# Patient Record
Sex: Female | Born: 1965 | Race: White | Hispanic: No | Marital: Married | Smoking: Former smoker
Health system: Southern US, Community
[De-identification: ages and names within clinical notes are randomized; demographics above are authoritative.]

## PROBLEM LIST (undated history)

## (undated) DIAGNOSIS — T7840XA Allergy, unspecified, initial encounter: Secondary | ICD-10-CM

## (undated) DIAGNOSIS — R1310 Dysphagia, unspecified: Secondary | ICD-10-CM

## (undated) DIAGNOSIS — D649 Anemia, unspecified: Secondary | ICD-10-CM

## (undated) DIAGNOSIS — E739 Lactose intolerance, unspecified: Secondary | ICD-10-CM

## (undated) DIAGNOSIS — M2550 Pain in unspecified joint: Secondary | ICD-10-CM

## (undated) DIAGNOSIS — F988 Other specified behavioral and emotional disorders with onset usually occurring in childhood and adolescence: Secondary | ICD-10-CM

## (undated) DIAGNOSIS — F419 Anxiety disorder, unspecified: Secondary | ICD-10-CM

## (undated) DIAGNOSIS — R079 Chest pain, unspecified: Secondary | ICD-10-CM

## (undated) DIAGNOSIS — M549 Dorsalgia, unspecified: Secondary | ICD-10-CM

## (undated) DIAGNOSIS — F319 Bipolar disorder, unspecified: Secondary | ICD-10-CM

## (undated) DIAGNOSIS — R0602 Shortness of breath: Secondary | ICD-10-CM

## (undated) DIAGNOSIS — M7989 Other specified soft tissue disorders: Secondary | ICD-10-CM

## (undated) DIAGNOSIS — K219 Gastro-esophageal reflux disease without esophagitis: Secondary | ICD-10-CM

## (undated) DIAGNOSIS — K5900 Constipation, unspecified: Secondary | ICD-10-CM

## (undated) DIAGNOSIS — F32A Depression, unspecified: Secondary | ICD-10-CM

## (undated) HISTORY — DX: Anemia, unspecified: D64.9

## (undated) HISTORY — DX: Bipolar disorder, unspecified: F31.9

## (undated) HISTORY — DX: Allergy, unspecified, initial encounter: T78.40XA

## (undated) HISTORY — DX: Constipation, unspecified: K59.00

## (undated) HISTORY — DX: Chest pain, unspecified: R07.9

## (undated) HISTORY — DX: Other specified soft tissue disorders: M79.89

## (undated) HISTORY — DX: Dysphagia, unspecified: R13.10

## (undated) HISTORY — DX: Gastro-esophageal reflux disease without esophagitis: K21.9

## (undated) HISTORY — DX: Depression, unspecified: F32.A

## (undated) HISTORY — DX: Pain in unspecified joint: M25.50

## (undated) HISTORY — DX: Dorsalgia, unspecified: M54.9

## (undated) HISTORY — DX: Lactose intolerance, unspecified: E73.9

## (undated) HISTORY — PX: CHOLECYSTECTOMY: SHX55

## (undated) HISTORY — DX: Other specified behavioral and emotional disorders with onset usually occurring in childhood and adolescence: F98.8

## (undated) HISTORY — DX: Anxiety disorder, unspecified: F41.9

## (undated) HISTORY — DX: Shortness of breath: R06.02

---

## 2004-01-15 ENCOUNTER — Emergency Department (HOSPITAL_COMMUNITY): Admission: EM | Admit: 2004-01-15 | Discharge: 2004-01-15 | Payer: Self-pay | Admitting: Emergency Medicine

## 2004-02-06 ENCOUNTER — Observation Stay (HOSPITAL_COMMUNITY): Admission: RE | Admit: 2004-02-06 | Discharge: 2004-02-07 | Payer: Self-pay | Admitting: Surgery

## 2004-02-06 ENCOUNTER — Encounter (INDEPENDENT_AMBULATORY_CARE_PROVIDER_SITE_OTHER): Payer: Self-pay | Admitting: Specialist

## 2005-03-27 IMAGING — RF DG CHOLANGIOGRAM OPERATIVE
1 series · 6 of 6 positions shown · non-contrast
Comparison: none

CLINICAL DATA: Symptomatic cholelithiasis. Right upper quadrant pain.
 INTRAOPERATIVE CHOLANGIOGRAM
 Three images are submitted from the intraoperative cholangiogram. This shows normal caliber biliary system.  No filling defects are seen to suggest retained stone.  Contrast passes into the small bowel.  
 IMPRESSION 
 No evidence of obstruction or retained stone.

[Series 1: run · 6 of 6 slices shown]
[im 1/6]
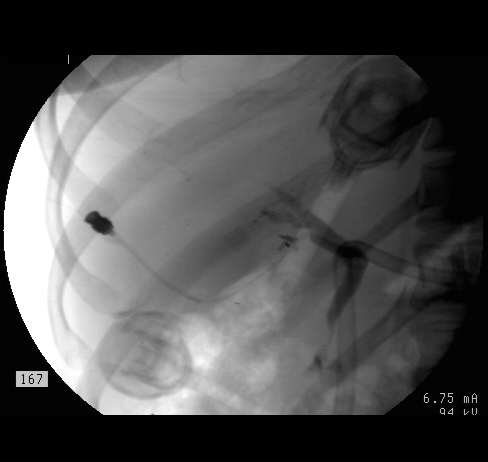
[im 2/6]
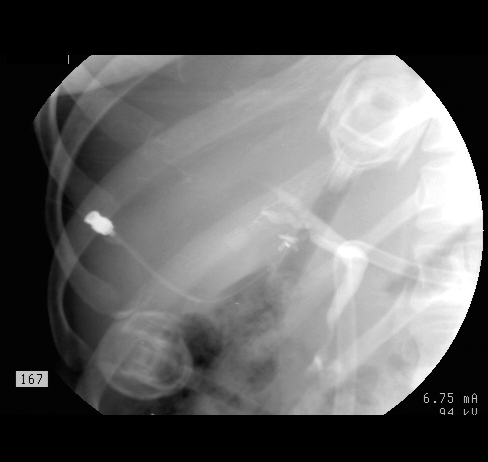
[im 3/6]
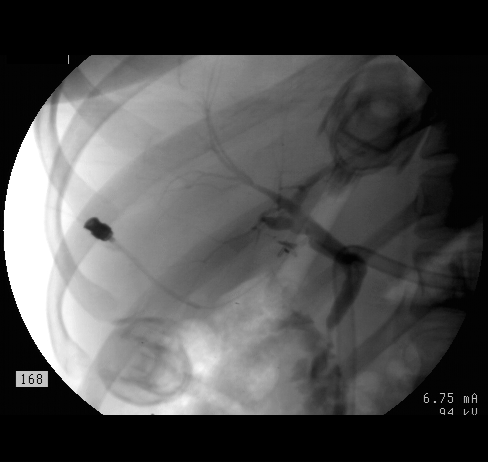
[im 4/6]
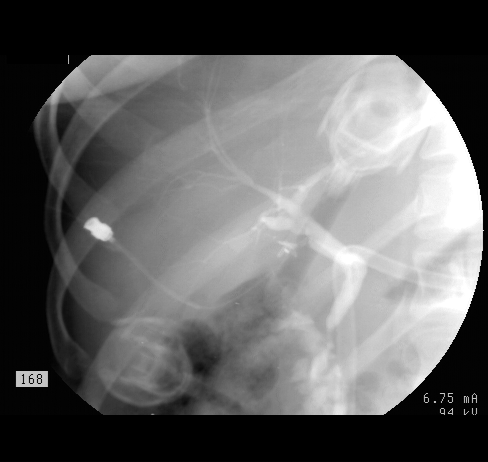
[im 5/6]
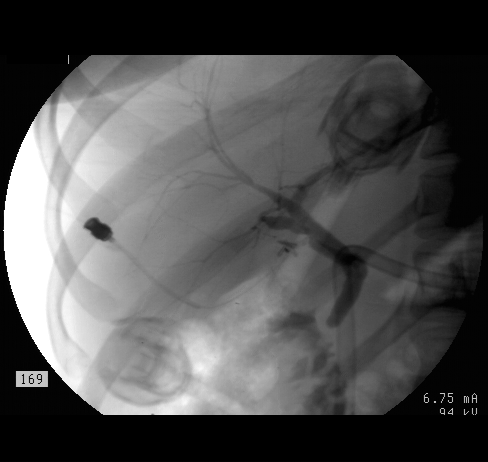
[im 6/6]
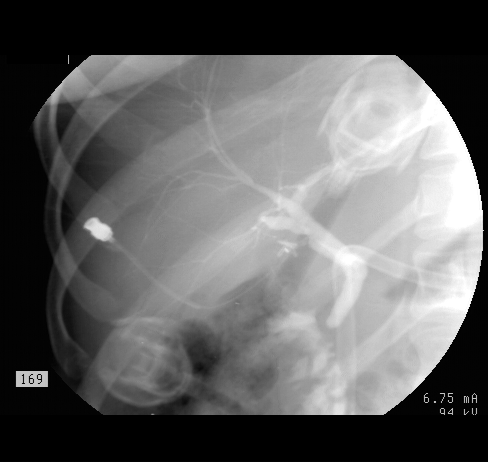

[6 of 6 positions shown; findings below may reference images not displayed]

## 2008-03-23 ENCOUNTER — Ambulatory Visit (HOSPITAL_COMMUNITY): Admission: RE | Admit: 2008-03-23 | Discharge: 2008-03-23 | Payer: Self-pay | Admitting: Family Medicine

## 2009-04-27 ENCOUNTER — Ambulatory Visit (HOSPITAL_COMMUNITY): Admission: RE | Admit: 2009-04-27 | Discharge: 2009-04-27 | Payer: Self-pay | Admitting: Family Medicine

## 2010-05-10 ENCOUNTER — Ambulatory Visit (HOSPITAL_COMMUNITY): Admission: RE | Admit: 2010-05-10 | Discharge: 2010-05-10 | Payer: Self-pay | Admitting: Anesthesiology

## 2011-04-19 NOTE — Op Note (Signed)
NAME:  Kathryn [name], Kathryn [name]                       ACCOUNT NO.:  [id number]   MEDICAL RECORD NO.:  [id number]                   PATIENT TYPE:  OBV   LOCATION:  0098                                 FACILITY:  [facility]   PHYSICIAN:  Abigail Miyamoto, M.D.              DATE OF BIRTH:  1966-11-04   DATE OF PROCEDURE:  02/06/2004  DATE OF DISCHARGE:                                 OPERATIVE REPORT   PREOPERATIVE DIAGNOSIS:  Symptomatic cholelithiasis.   POSTOPERATIVE DIAGNOSIS:  Symptomatic cholelithiasis.   PROCEDURE:  Laparoscopic cholecystectomy with intraoperative cholangiogram.   SURGEON:  Abigail Miyamoto, M.D.   ANESTHESIA:  General endotracheal anesthesia.   ESTIMATED BLOOD LOSS:  Minimal.   FINDINGS:  The patient was noted to have a normal cholangiogram.   PROCEDURE IN DETAIL:  The patient was brought to the operating room and  identified as Kathryn [name].  She was placed supine upon the operating  table and general anesthesia was induced.  Her abdomen was then prepped and  draped in the usual sterile fashion.  Using a #15 blade a small vertical  incision was made below the umbilicus.  The incision was carried down to the  fascia, which was opened up with a scalpel.  A hemostat was used to pass  through the peritoneal cavity.  Next a 0 Vicryl pursestring suture was  placed around the fascial opening.  The Hasson port was placed through the  opening and insufflation of the abdomen was begun.  A 12 mm port was then  placed in the patient's epigastrium and two 5 mm ports placed in the  patient's right flank under direct vision.  The gallbladder was then  grasped, retracted above the liver bed.  Several adhesions to the  gallbladder were taken down bluntly.  The cystic duct and cystic artery were  then easily dissected out.  The cystic artery was found to be slightly  posterolateral.  It was clipped twice proximally and once distally and  partly transected with scissors.  The  cystic duct was then clipped once  distally and partly opened with the laparoscopic scissors as well.  An  angiocatheter was then inserted in the right upper quadrant under direct  vision.  A cholangiocatheter was passed through this and placed into the  opening in the cystic duct under direct vision.  A cholangiogram was then  performed with contrast under direct fluoroscopy.  Good flow of contrast was  seen in the entire biliary system and duodenum without evidence of  abnormality.  The cholangiocatheter was then removed.  The cystic duct was  then clipped three times proximally and completely transected.  The  gallbladder was then slowly dissected free from the liver bed with the  electrocautery.  Once the gallbladder was free from the liver bed,  hemostasis was achieved with the cautery.  The gallbladder was then removed  through the incision at the umbilicus.  The 0 Vicryl  at the umbilicus was  tied in place, closing the fascial defect.  The liver bed was again examined  and hemostasis felt to have been achieved.  The abdomen was then irrigated  with normal saline.  All ports were then removed under direct vision and the  abdomen was deflated.  All incisions were anesthetized with 0.25% Marcaine  and closed with 4-0 Monocryl subcuticular  sutures.  Steri-Strips, gauze, and tape were then applied.  The patient  tolerated the procedure well.  All counts were correct at the end of the  procedure, and the patient was then extubated in the operating room and  taken in stable condition to the recovery room.                                               Abigail Miyamoto, M.D.    DB/MEDQ  D:  02/06/2004  T:  02/06/2004  Job:  [id number]

## 2011-06-10 ENCOUNTER — Other Ambulatory Visit: Payer: Self-pay | Admitting: Obstetrics & Gynecology

## 2011-06-10 DIAGNOSIS — Z1231 Encounter for screening mammogram for malignant neoplasm of breast: Secondary | ICD-10-CM

## 2011-06-14 ENCOUNTER — Ambulatory Visit (HOSPITAL_COMMUNITY): Payer: Medicaid Other

## 2011-06-20 ENCOUNTER — Ambulatory Visit (HOSPITAL_COMMUNITY)
Admission: RE | Admit: 2011-06-20 | Discharge: 2011-06-20 | Disposition: A | Discharge status: Home / Self Care | Payer: Medicaid Other | Source: Ambulatory Visit | Attending: Obstetrics & Gynecology | Admitting: Obstetrics & Gynecology

## 2011-06-20 DIAGNOSIS — Z1231 Encounter for screening mammogram for malignant neoplasm of breast: Secondary | ICD-10-CM

## 2012-05-25 ENCOUNTER — Other Ambulatory Visit (HOSPITAL_COMMUNITY): Payer: Self-pay | Admitting: Family Medicine

## 2012-05-25 DIAGNOSIS — Z1231 Encounter for screening mammogram for malignant neoplasm of breast: Secondary | ICD-10-CM

## 2012-06-25 ENCOUNTER — Ambulatory Visit (HOSPITAL_COMMUNITY)
Admission: RE | Admit: 2012-06-25 | Discharge: 2012-06-25 | Disposition: A | Discharge status: Home / Self Care | Payer: BC Managed Care – PPO | Source: Ambulatory Visit | Attending: Family Medicine | Admitting: Family Medicine

## 2012-06-25 DIAGNOSIS — Z1231 Encounter for screening mammogram for malignant neoplasm of breast: Secondary | ICD-10-CM | POA: Insufficient documentation

## 2013-05-24 ENCOUNTER — Other Ambulatory Visit (HOSPITAL_COMMUNITY): Payer: Self-pay | Admitting: *Deleted

## 2013-05-24 DIAGNOSIS — Z1231 Encounter for screening mammogram for malignant neoplasm of breast: Secondary | ICD-10-CM

## 2013-06-28 ENCOUNTER — Ambulatory Visit (HOSPITAL_COMMUNITY): Payer: BC Managed Care – PPO

## 2013-07-05 ENCOUNTER — Ambulatory Visit (HOSPITAL_COMMUNITY)
Admission: RE | Admit: 2013-07-05 | Discharge: 2013-07-05 | Disposition: A | Discharge status: Home / Self Care | Payer: BC Managed Care – PPO | Source: Ambulatory Visit | Attending: *Deleted | Admitting: *Deleted

## 2013-07-05 DIAGNOSIS — Z1231 Encounter for screening mammogram for malignant neoplasm of breast: Secondary | ICD-10-CM | POA: Insufficient documentation

## 2014-06-09 ENCOUNTER — Other Ambulatory Visit (HOSPITAL_COMMUNITY): Payer: Self-pay | Admitting: *Deleted

## 2014-06-09 DIAGNOSIS — Z1231 Encounter for screening mammogram for malignant neoplasm of breast: Secondary | ICD-10-CM

## 2014-07-06 ENCOUNTER — Ambulatory Visit (HOSPITAL_COMMUNITY)
Admission: RE | Admit: 2014-07-06 | Discharge: 2014-07-06 | Disposition: A | Discharge status: Home / Self Care | Payer: BC Managed Care – PPO | Source: Ambulatory Visit | Attending: *Deleted | Admitting: *Deleted

## 2014-07-06 DIAGNOSIS — Z1231 Encounter for screening mammogram for malignant neoplasm of breast: Secondary | ICD-10-CM | POA: Insufficient documentation

## 2015-06-27 ENCOUNTER — Other Ambulatory Visit (HOSPITAL_COMMUNITY): Payer: Self-pay | Admitting: *Deleted

## 2015-06-27 DIAGNOSIS — Z1231 Encounter for screening mammogram for malignant neoplasm of breast: Secondary | ICD-10-CM

## 2015-07-10 ENCOUNTER — Ambulatory Visit (HOSPITAL_COMMUNITY)
Admission: RE | Admit: 2015-07-10 | Discharge: 2015-07-10 | Disposition: A | Discharge status: Home / Self Care | Payer: BLUE CROSS/BLUE SHIELD | Source: Ambulatory Visit | Attending: *Deleted | Admitting: *Deleted

## 2015-07-10 DIAGNOSIS — Z1231 Encounter for screening mammogram for malignant neoplasm of breast: Secondary | ICD-10-CM | POA: Diagnosis not present

## 2016-06-17 ENCOUNTER — Other Ambulatory Visit: Payer: Self-pay | Admitting: *Deleted

## 2016-06-17 DIAGNOSIS — Z1231 Encounter for screening mammogram for malignant neoplasm of breast: Secondary | ICD-10-CM

## 2016-07-15 ENCOUNTER — Ambulatory Visit
Admission: RE | Admit: 2016-07-15 | Discharge: 2016-07-15 | Disposition: A | Discharge status: Home / Self Care | Payer: BLUE CROSS/BLUE SHIELD | Source: Ambulatory Visit | Attending: *Deleted | Admitting: *Deleted

## 2016-07-15 DIAGNOSIS — Z1231 Encounter for screening mammogram for malignant neoplasm of breast: Secondary | ICD-10-CM

## 2017-05-28 ENCOUNTER — Encounter: Payer: Self-pay | Admitting: Physician Assistant

## 2017-06-06 ENCOUNTER — Other Ambulatory Visit: Payer: Self-pay | Admitting: *Deleted

## 2017-06-06 ENCOUNTER — Ambulatory Visit (AMBULATORY_SURGERY_CENTER): Payer: Self-pay | Admitting: *Deleted

## 2017-06-06 VITALS — Ht 64.75 in | Wt 200.2 lb

## 2017-06-06 DIAGNOSIS — Z1231 Encounter for screening mammogram for malignant neoplasm of breast: Secondary | ICD-10-CM

## 2017-06-06 DIAGNOSIS — Z1211 Encounter for screening for malignant neoplasm of colon: Secondary | ICD-10-CM

## 2017-06-06 MED ORDER — NA SULFATE-K SULFATE-MG SULF 17.5-3.13-1.6 GM/177ML PO SOLN: 1.0000 | Freq: Once | ORAL | 0 refills | Status: AC

## 2017-06-06 NOTE — Progress Notes (Signed)
No egg or soy allergy known to patient  No issues with past sedation with any surgeries  or procedures, no intubation problems  No diet pills per patient No home 02 use per patient  No blood thinners per patient  Pt denies issues with constipation  No A fib or A flutter  EMMI video sent to pt's e mail  

## 2017-06-16 ENCOUNTER — Encounter: Payer: BLUE CROSS/BLUE SHIELD | Admitting: Gastroenterology

## 2017-07-01 ENCOUNTER — Encounter: Payer: Self-pay | Admitting: Gastroenterology

## 2017-07-11 ENCOUNTER — Encounter: Payer: Self-pay | Admitting: Gastroenterology

## 2017-07-11 ENCOUNTER — Ambulatory Visit (AMBULATORY_SURGERY_CENTER): Payer: BLUE CROSS/BLUE SHIELD | Admitting: Gastroenterology

## 2017-07-11 VITALS — BP 139/84 | HR 63 | Temp 98.0°F | Resp 12 | Ht 64.75 in | Wt 200.0 lb

## 2017-07-11 DIAGNOSIS — Z1212 Encounter for screening for malignant neoplasm of rectum: Secondary | ICD-10-CM | POA: Diagnosis not present

## 2017-07-11 DIAGNOSIS — Z1211 Encounter for screening for malignant neoplasm of colon: Secondary | ICD-10-CM

## 2017-07-11 MED ORDER — SODIUM CHLORIDE 0.9 % IV SOLN: 500.0000 mL | INTRAVENOUS | Status: AC

## 2017-07-11 NOTE — Patient Instructions (Signed)
YOU HAD AN ENDOSCOPIC PROCEDURE TODAY AT THE [facility]:   Refer to the procedure report that was given to you for any specific questions about what was found during the examination.  If the procedure report does not answer your questions, please call your gastroenterologist to clarify.  If you requested that your care partner not be given the details of your procedure findings, then the procedure report has been included in a sealed envelope for you to review at your convenience later.  YOU SHOULD EXPECT: Some feelings of bloating in the abdomen. Passage of more gas than usual.  Walking can help get rid of the air that was put into your GI tract during the procedure and reduce the bloating. If you had a lower endoscopy (such as a colonoscopy or flexible sigmoidoscopy) you may notice spotting of blood in your stool or on the toilet paper. If you underwent a bowel prep for your procedure, you may not have a normal bowel movement for a few days.  Please Note:  You might notice some irritation and congestion in your nose or some drainage.  This is from the oxygen used during your procedure.  There is no need for concern and it should clear up in a day or so.  SYMPTOMS TO REPORT IMMEDIATELY:   Following lower endoscopy (colonoscopy or flexible sigmoidoscopy):  Excessive amounts of blood in the stool  Significant tenderness or worsening of abdominal pains  Swelling of the abdomen that is new, acute  Fever of 100F or higher  For urgent or emergent issues, a gastroenterologist can be reached at any hour by calling [phone].   DIET:  We do recommend a small meal at first, but then you may proceed to your regular diet.  Drink plenty of fluids but you should avoid alcoholic beverages for 24 hours.  MEDICATIONS: Continue present medications.  Please see handouts given to you by your recovery nurse.  ACTIVITY:  You should plan to take it easy for the rest of today and you should NOT  DRIVE or use heavy machinery until tomorrow (because of the sedation medicines used during the test).    FOLLOW UP: Our staff will call the number listed on your records the next business day following your procedure to check on you and address any questions or concerns that you may have regarding the information given to you following your procedure. If we do not reach you, we will leave a message.  However, if you are feeling well and you are not experiencing any problems, there is no need to return our call.  We will assume that you have returned to your regular daily activities without incident.  If any biopsies were taken you will be contacted by phone or by letter within the next 1-3 weeks.  Please call us at [phone] if you have not heard about the biopsies in 3 weeks.   Thank you for allowing us to provide for your healthcare needs today.   SIGNATURES/CONFIDENTIALITY: You and/or your care partner have signed paperwork which will be entered into your electronic medical record.  These signatures attest to the fact that that the information above on your After Visit Summary has been reviewed and is understood.  Full responsibility of the confidentiality of this discharge information lies with you and/or your care-partner. 

## 2017-07-11 NOTE — Op Note (Signed)
[facility] Patient Name: Kathryn FarrierCharlene Brandy Procedure Date: 07/11/2017 1:55 PM MRN: [id number] Endoscopist: Napoleon FormKavitha V. Nandigam , MD Age: 51 Referring MD:  Date of Birth: [birth year] Gender: Female Account #: [id number] Procedure:                Colonoscopy Indications:              Screening for colorectal malignant neoplasm, This                            is the patient's first colonoscopy Medicines:                Monitored Anesthesia Care Procedure:                Pre-Anesthesia Assessment:                           - Prior to the procedure, a History and Physical                            was performed, and patient medications and                            allergies were reviewed. The patient's tolerance of                            previous anesthesia was also reviewed. The risks                            and benefits of the procedure and the sedation                            options and risks were discussed with the patient.                            All questions were answered, and informed consent                            was obtained. Prior Anticoagulants: The patient has                            taken no previous anticoagulant or antiplatelet                            agents. ASA Grade Assessment: II - A patient with                            mild systemic disease. After reviewing the risks                            and benefits, the patient was deemed in                            satisfactory condition to undergo the procedure.  After obtaining informed consent, the colonoscope                            was passed under direct vision. Throughout the                            procedure, the patient's blood pressure, pulse, and                            oxygen saturations were monitored continuously. The                            Colonoscope was introduced through the anus and                            advanced to the the  terminal ileum, with                            identification of the appendiceal orifice and IC                            valve. The colonoscopy was performed without                            difficulty. The patient tolerated the procedure                            well. The quality of the bowel preparation was                            excellent. The terminal ileum, ileocecal valve,                            appendiceal orifice, and rectum were photographed. Scope In: 1:57:45 PM Scope Out: 2:11:19 PM Scope Withdrawal Time: 0 hours 8 minutes 53 seconds  Total Procedure Duration: 0 hours 13 minutes 34 seconds  Findings:                 The perianal and digital rectal examinations were                            normal.                           Non-bleeding internal hemorrhoids were found during                            retroflexion. The hemorrhoids were small.                           The exam was otherwise without abnormality. Complications:            No immediate complications. Estimated Blood Loss:     Estimated blood loss: none. Impression:               - Non-bleeding internal hemorrhoids.                           -  The examination was otherwise normal.                           - No specimens collected. Recommendation:           - Patient has a contact number available for                            emergencies. The signs and symptoms of potential                            delayed complications were discussed with the                            patient. Return to normal activities tomorrow.                            Written discharge instructions were provided to the                            patient.                           - Resume previous diet.                           - Continue present medications.                           - Repeat colonoscopy in 10 years for screening                            purposes. Napoleon Form, MD 07/11/2017 2:19:46  PM This report has been signed electronically.

## 2017-07-11 NOTE — Progress Notes (Signed)
To recovery, report to Hines, RN, VSS 

## 2017-07-11 NOTE — Progress Notes (Signed)
Pt's states no medical or surgical changes since previsit or office visit. 

## 2017-07-14 ENCOUNTER — Telehealth: Payer: Self-pay

## 2017-07-14 NOTE — Telephone Encounter (Signed)
Attempted to reach pt. With follow up call following endoscopic procedure 07/11/2017.   LM on pt. Ans machine to call if she has any questions or concerns.

## 2017-07-14 NOTE — Telephone Encounter (Signed)
Attempted to reach pt. With follow up call following endoscopic procedure 07/11/17.  LM on pt. Ans. Machine.   Will try to reach pt. Later today.

## 2017-07-17 ENCOUNTER — Ambulatory Visit
Admission: RE | Admit: 2017-07-17 | Discharge: 2017-07-17 | Disposition: A | Discharge status: Home / Self Care | Payer: BLUE CROSS/BLUE SHIELD | Source: Ambulatory Visit | Attending: *Deleted | Admitting: *Deleted

## 2017-07-17 DIAGNOSIS — Z1231 Encounter for screening mammogram for malignant neoplasm of breast: Secondary | ICD-10-CM

## 2018-06-09 ENCOUNTER — Other Ambulatory Visit: Payer: Self-pay | Admitting: *Deleted

## 2018-06-09 DIAGNOSIS — Z1231 Encounter for screening mammogram for malignant neoplasm of breast: Secondary | ICD-10-CM

## 2018-07-20 ENCOUNTER — Ambulatory Visit: Payer: BLUE CROSS/BLUE SHIELD

## 2018-08-17 ENCOUNTER — Ambulatory Visit: Payer: BLUE CROSS/BLUE SHIELD

## 2018-09-28 ENCOUNTER — Ambulatory Visit: Payer: BLUE CROSS/BLUE SHIELD

## 2018-10-12 ENCOUNTER — Ambulatory Visit
Admission: RE | Admit: 2018-10-12 | Discharge: 2018-10-12 | Disposition: A | Discharge status: Home / Self Care | Payer: BLUE CROSS/BLUE SHIELD | Source: Ambulatory Visit | Attending: *Deleted | Admitting: *Deleted

## 2018-10-12 DIAGNOSIS — Z1231 Encounter for screening mammogram for malignant neoplasm of breast: Secondary | ICD-10-CM

## 2019-04-06 ENCOUNTER — Ambulatory Visit: Payer: BLUE CROSS/BLUE SHIELD | Admitting: Orthopaedic Surgery

## 2019-04-06 ENCOUNTER — Other Ambulatory Visit: Payer: Self-pay

## 2019-04-06 ENCOUNTER — Encounter: Payer: Self-pay | Admitting: Orthopaedic Surgery

## 2019-04-06 DIAGNOSIS — M25531 Pain in right wrist: Secondary | ICD-10-CM | POA: Diagnosis not present

## 2019-04-06 NOTE — Progress Notes (Signed)
Office Visit Note   Patient: Kathryn [name]           Date of Birth: 1966-08-04           MRN: [id number] Visit Date: 04/06/2019              Requested by: Marva Panda, NP [address] [place], [place] [address] PCP: Marva Panda, NP   Assessment & Plan: Visit Diagnoses:  1. Pain in right wrist     Plan: We will obtain EMG nerve conduction studies to rule out right carpal tunnel syndrome as a source of her right wrist pain.  In the interim she will take over-the-counter sedatives and also continue using the wrist brace.  Questions were encouraged and answered. Discussed other options with her which would include splinting, NSAIDs and time versus cortisone injection.  She states that she would rather find out the source of her pain.  Follow-Up Instructions: Return After EMG/NCS.   Orders:  Orders Placed This Encounter  Procedures   Ambulatory referral to Physical Medicine Rehab   No orders of the defined types were placed in this encounter.     Procedures: No procedures performed   Clinical Data: No additional findings.   Subjective: Chief Complaint  Patient presents with   Right Wrist - Pain    HPI His right 53 year old female seen for the first time.  We have know her due to the fact that we have performed bilateral knee replacements and her husband.  She comes in due to right wrist pain.  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  She does describe fact that her hand falls asleep and she has to shake her hand due to the numbness and pain.  He notes that the pain does occasionally radiate up the arm.  She also notes some swelling about the wrist states this is gone down since she began trying a wrist brace.  She is had pain in her right hand and wrist for years now it is becoming worse again due to doing remote learning as a Runner, broadcasting/film/video.  She denies any injury to the  wrist. Review of Systems See HPI  Objective: Vital Signs: There were no vitals taken for this visit.  Physical Exam Constitutional:      Appearance: She is not ill-appearing or diaphoretic.  Pulmonary:     Effort: Pulmonary effort is normal.  Neurological:     Mental Status: She is alert and oriented to person, place, and time.  Psychiatric:        Mood and Affect: Mood normal.     Ortho Exam Positive Tinel's over the median nerve right wrist negative on the left.  Negative Phalen's bilaterally.  Negative compression test bilaterally.  No rashes skin lesions ulcerations erythema or edema of either wrist.  She has full dorsiflexion and volar flexion of the wrist without pain.  Full sensation throughout both hands to light touch.  Radial pulses 2+ bilateral.  Specialty Comments:  No specialty comments available.  Imaging: No results found.   PMFS History: There are no active problems to display for this patient.  Past Medical History:  Diagnosis Date   Allergy    Anemia    with pregnancy    Anxiety    GERD (gastroesophageal reflux disease)    mild- prn tums     Family History  Problem Relation Age  of Onset   Stomach cancer Maternal Grandfather    Cervical cancer Mother    Colon cancer Neg Hx    Colon polyps Neg Hx    Rectal cancer Neg Hx     Past Surgical History:  Procedure Laterality Date   CESAREAN SECTION     CHOLECYSTECTOMY     Social History   Occupational History   Not on file  Tobacco Use   Smoking status: Former Smoker   Smokeless tobacco: Never Used  Substance and Sexual Activity   Alcohol use: Yes    Comment: very rare   Drug use: No   Sexual activity: Not on file

## 2019-05-06 ENCOUNTER — Ambulatory Visit (INDEPENDENT_AMBULATORY_CARE_PROVIDER_SITE_OTHER): Payer: BLUE CROSS/BLUE SHIELD | Admitting: Physical Medicine and Rehabilitation

## 2019-05-06 ENCOUNTER — Other Ambulatory Visit: Payer: Self-pay

## 2019-05-06 ENCOUNTER — Encounter: Payer: Self-pay | Admitting: Physical Medicine and Rehabilitation

## 2019-05-06 DIAGNOSIS — R202 Paresthesia of skin: Secondary | ICD-10-CM

## 2019-05-06 NOTE — Procedures (Signed)
EMG & NCV Findings: Evaluation of the right median motor nerve showed prolonged distal onset latency (4.5 ms) and decreased conduction velocity (Elbow-Wrist, 48 m/s).  The right median (across palm) sensory nerve showed prolonged distal peak latency (Wrist, 4.6 ms).  All remaining nerves (as indicated in the following tables) were within normal limits.    All examined muscles (as indicated in the following table) showed no evidence of electrical instability.    Impression: The above electrodiagnostic study is ABNORMAL and reveals evidence of a moderate right median nerve entrapment at the wrist (carpal tunnel syndrome) affecting sensory and motor components. This does not necessarily explain symptoms in the ring and fifth digit as she would expect this to be more of the radial digits but it can include the ring finger.  There is no significant electrodiagnostic evidence of any other focal nerve entrapment, brachial plexopathy or cervical radiculopathy.   Recommendations: 1.  Follow-up with referring physician. 2.  Continue current management of symptoms. 3.  Continue use of resting splint at night-time and as needed during the day. 4.  Suggest surgical evaluation.  ___________________________ Kathryn PlummerFred Taffie [name] FAAPMR Board Certified, American Board of Physical Medicine and Rehabilitation    Nerve Conduction Studies Anti Sensory Summary Table   Stim Site NR Peak (ms) Norm Peak (ms) P-T Amp (V) Norm P-T Amp Site1 Site2 Delta-P (ms) Dist (cm) Vel (m/s) Norm Vel (m/s)  Right Median Acr Palm Anti Sensory (2nd Digit)  32.2C  Wrist    *4.6 <3.6 16.5 >10 Wrist Palm 2.6 0.0    Palm    2.0 <2.0 7.1         Right Radial Anti Sensory (Base 1st Digit)  31.5C  Wrist    2.2 <3.1 20.6  Wrist Base 1st Digit 2.2 0.0    Right Ulnar Anti Sensory (5th Digit)  32.2C  Wrist    3.1 <3.7 17.6 >15.0 Wrist 5th Digit 3.1 14.0 45 >38   Motor Summary Table   Stim Site NR Onset (ms) Norm Onset (ms) O-P Amp (mV)  Norm O-P Amp Site1 Site2 Delta-0 (ms) Dist (cm) Vel (m/s) Norm Vel (m/s)  Right Median Motor (Abd Poll Brev)  31.8C  Wrist    *4.5 <4.2 7.9 >5 Elbow Wrist 4.1 19.5 *48 >50  Elbow    8.6  7.4         Right Ulnar Motor (Abd Dig Min)  31.9C  Wrist    3.0 <4.2 12.6 >3 B Elbow Wrist 3.3 18.5 56 >53  B Elbow    6.3  11.7  A Elbow B Elbow 1.1 9.5 86 >53  A Elbow    7.4  12.0          EMG   Side Muscle Nerve Root Ins Act Fibs Psw Amp Dur Poly Recrt Int Dennie BiblePat Comment  Right Abd Poll Brev Median C8-T1 Nml Nml Nml Nml Nml 0 Nml Nml   Right 1stDorInt Ulnar C8-T1 Nml Nml Nml Nml Nml 0 Nml Nml   Right PronatorTeres Median C6-7 Nml Nml Nml Nml Nml 0 Nml Nml   Right Biceps Musculocut C5-6 Nml Nml Nml Nml Nml 0 Nml Nml   Right Deltoid Axillary C5-6 Nml Nml Nml Nml Nml 0 Nml Nml     Nerve Conduction Studies Anti Sensory Left/Right Comparison   Stim Site L Lat (ms) R Lat (ms) L-R Lat (ms) L Amp (V) R Amp (V) L-R Amp (%) Site1 Site2 L Vel (m/s) R Vel (m/s) L-R Vel (m/s)  Median Acr Palm Anti Sensory (2nd Digit)  32.2C  Wrist  *4.6   16.5  Wrist Palm     Palm  2.0   7.1        Radial Anti Sensory (Base 1st Digit)  31.5C  Wrist  2.2   20.6  Wrist Base 1st Digit     Ulnar Anti Sensory (5th Digit)  32.2C  Wrist  3.1   17.6  Wrist 5th Digit  45    Motor Left/Right Comparison   Stim Site L Lat (ms) R Lat (ms) L-R Lat (ms) L Amp (mV) R Amp (mV) L-R Amp (%) Site1 Site2 L Vel (m/s) R Vel (m/s) L-R Vel (m/s)  Median Motor (Abd Poll Brev)  31.8C  Wrist  *4.5   7.9  Elbow Wrist  *48   Elbow  8.6   7.4        Ulnar Motor (Abd Dig Min)  31.9C  Wrist  3.0   12.6  B Elbow Wrist  56   B Elbow  6.3   11.7  A Elbow B Elbow  86   A Elbow  7.4   12.0           Waveforms:

## 2019-05-06 NOTE — Progress Notes (Signed)
  Numeric Pain Rating Scale and Functional Assessment Average Pain 8   In the last MONTH (on 0-10 scale) has pain interfered with the following?  1. General activity like being  able to carry out your everyday physical activities such as walking, climbing stairs, carrying groceries, or moving a chair?  Rating(6)     

## 2019-05-06 NOTE — Progress Notes (Signed)
Kathryn RisingCharlene Motley [name] - 53 y.o. female MRN [id number]  Date of birth: February 08, 1966  Office Visit Note: Visit Date: 05/06/2019 PCP: Marva PandaMillsaps, Kimberly, NP Referred by: Marva PandaMillsaps, Kimberly, NP  Subjective: Chief Complaint  Patient presents with  . Right Hand - Pain, Numbness, Tingling  . Right Forearm - Tingling, Pain, Numbness  . Right Wrist - Pain, Numbness, Tingling   HPI:  Kathryn [name] is a 53 y.o. female who comes in today At the request of Dr. Doneen Poissonhristopher Blackman for electrodiagnostic of the right upper limb.  She reports chronic worsening severe at times pain in the right wrist with numbness tingling in the right hand in particular the ring and fifth digit.  She does get some referral up in the forearm.  She reports many years of worsening symptoms.  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  She denies left-sided complaints.  She is right-hand dominant.  She has no radicular pain complaints.  No prior carpal tunnel release surgery or electrodiagnostic study.  Symptoms worse with movement at night and does get some relief wearing the brace and using medication.  ROS Otherwise per HPI.  Assessment & Plan: Visit Diagnoses:  1. Paresthesia of skin     Plan: Impression: The above electrodiagnostic study is ABNORMAL and reveals evidence of a moderate right median nerve entrapment at the wrist (carpal tunnel syndrome) affecting sensory and motor components.  This does not necessarily explain symptoms in the ring and fifth digit as she would expect this to be more of the radial digits but it can include the ring finger.  There is no significant electrodiagnostic evidence of any other focal nerve entrapment, brachial plexopathy or cervical radiculopathy.   Recommendations: 1.  Follow-up with referring physician. 2.  Continue current management of symptoms. 3.  Continue use of resting splint at night-time and as needed during the day. 4.  Suggest surgical  evaluation.  Meds & Orders: No orders of the defined types were placed in this encounter.   Orders Placed This Encounter  Procedures  . NCV with EMG (electromyography)    Follow-up: Return for Doneen Poissonhristopher Blackman, M.D..   Procedures: No procedures performed  EMG & NCV Findings: Evaluation of the right median motor nerve showed prolonged distal onset latency (4.5 ms) and decreased conduction velocity (Elbow-Wrist, 48 m/s).  The right median (across palm) sensory nerve showed prolonged distal peak latency (Wrist, 4.6 ms).  All remaining nerves (as indicated in the following tables) were within normal limits.    All examined muscles (as indicated in the following table) showed no evidence of electrical instability.    Impression: The above electrodiagnostic study is ABNORMAL and reveals evidence of a moderate right median nerve entrapment at the wrist (carpal tunnel syndrome) affecting sensory and motor components. This does not necessarily explain symptoms in the ring and fifth digit as she would expect this to be more of the radial digits but it can include the ring finger.  There is no significant electrodiagnostic evidence of any other focal nerve entrapment, brachial plexopathy or cervical radiculopathy.   Recommendations: 1.  Follow-up with referring physician. 2.  Continue current management of symptoms. 3.  Continue use of resting splint at night-time and as needed during the day. 4.  Suggest surgical evaluation.  ___________________________ Elease HashimotoFred Tyrann Donaho FAAPMR Board Certified, American Board of Physical Medicine and Rehabilitation    Nerve Conduction Studies Anti Sensory Summary Table   Stim Site NR Peak (ms) Norm Peak (  ms) P-T Amp (V) Norm P-T Amp Site1 Site2 Delta-P (ms) Dist (cm) Vel (m/s) Norm Vel (m/s)  Right Median Acr Palm Anti Sensory (2nd Digit)  32.2C  Wrist    *4.6 <3.6 16.5 >10 Wrist Palm 2.6 0.0    Palm    2.0 <2.0 7.1         Right Radial Anti Sensory  (Base 1st Digit)  31.5C  Wrist    2.2 <3.1 20.6  Wrist Base 1st Digit 2.2 0.0    Right Ulnar Anti Sensory (5th Digit)  32.2C  Wrist    3.1 <3.7 17.6 >15.0 Wrist 5th Digit 3.1 14.0 45 >38   Motor Summary Table   Stim Site NR Onset (ms) Norm Onset (ms) O-P Amp (mV) Norm O-P Amp Site1 Site2 Delta-0 (ms) Dist (cm) Vel (m/s) Norm Vel (m/s)  Right Median Motor (Abd Poll Brev)  31.8C  Wrist    *4.5 <4.2 7.9 >5 Elbow Wrist 4.1 19.5 *48 >50  Elbow    8.6  7.4         Right Ulnar Motor (Abd Dig Min)  31.9C  Wrist    3.0 <4.2 12.6 >3 B Elbow Wrist 3.3 18.5 56 >53  B Elbow    6.3  11.7  A Elbow B Elbow 1.1 9.5 86 >53  A Elbow    7.4  12.0          EMG   Side Muscle Nerve Root Ins Act Fibs Psw Amp Dur Poly Recrt Int Dennie Bible Comment  Right Abd Poll Brev Median C8-T1 Nml Nml Nml Nml Nml 0 Nml Nml   Right 1stDorInt Ulnar C8-T1 Nml Nml Nml Nml Nml 0 Nml Nml   Right PronatorTeres Median C6-7 Nml Nml Nml Nml Nml 0 Nml Nml   Right Biceps Musculocut C5-6 Nml Nml Nml Nml Nml 0 Nml Nml   Right Deltoid Axillary C5-6 Nml Nml Nml Nml Nml 0 Nml Nml     Nerve Conduction Studies Anti Sensory Left/Right Comparison   Stim Site L Lat (ms) R Lat (ms) L-R Lat (ms) L Amp (V) R Amp (V) L-R Amp (%) Site1 Site2 L Vel (m/s) R Vel (m/s) L-R Vel (m/s)  Median Acr Palm Anti Sensory (2nd Digit)  32.2C  Wrist  *4.6   16.5  Wrist Palm     Palm  2.0   7.1        Radial Anti Sensory (Base 1st Digit)  31.5C  Wrist  2.2   20.6  Wrist Base 1st Digit     Ulnar Anti Sensory (5th Digit)  32.2C  Wrist  3.1   17.6  Wrist 5th Digit  45    Motor Left/Right Comparison   Stim Site L Lat (ms) R Lat (ms) L-R Lat (ms) L Amp (mV) R Amp (mV) L-R Amp (%) Site1 Site2 L Vel (m/s) R Vel (m/s) L-R Vel (m/s)  Median Motor (Abd Poll Brev)  31.8C  Wrist  *4.5   7.9  Elbow Wrist  *48   Elbow  8.6   7.4        Ulnar Motor (Abd Dig Min)  31.9C  Wrist  3.0   12.6  B Elbow Wrist  56   B Elbow  6.3   11.7  A Elbow B Elbow  86   A Elbow   7.4   12.0           Waveforms:  Clinical History: No specialty comments available.     Objective:  VS:  HT:    WT:   BMI:     BP:   HR: bpm  TEMP: ( )  RESP:  Physical Exam Musculoskeletal:        General: No swelling, tenderness or deformity.     Comments: Inspection reveals no atrophy of the bilateral APB or FDI or hand intrinsics. There is no swelling, color changes, allodynia or dystrophic changes. There is 5 out of 5 strength in the bilateral wrist extension, finger abduction and long finger flexion.  There is a negative Hoffmann's test bilaterally.  Skin:    General: Skin is warm and dry.     Findings: No erythema or rash.  Neurological:     General: No focal deficit present.     Mental Status: She is alert and oriented to person, place, and time.     Motor: No weakness or abnormal muscle tone.     Coordination: Coordination normal.  Psychiatric:        Mood and Affect: Mood normal.        Behavior: Behavior normal.     Ortho Exam Imaging: No results found.

## 2019-05-13 ENCOUNTER — Ambulatory Visit: Payer: Self-pay

## 2019-05-13 ENCOUNTER — Other Ambulatory Visit: Payer: Self-pay

## 2019-05-13 ENCOUNTER — Encounter: Payer: Self-pay | Admitting: Physician Assistant

## 2019-05-13 ENCOUNTER — Ambulatory Visit (INDEPENDENT_AMBULATORY_CARE_PROVIDER_SITE_OTHER): Payer: BC Managed Care – PPO | Admitting: Physician Assistant

## 2019-05-13 DIAGNOSIS — M25562 Pain in left knee: Secondary | ICD-10-CM | POA: Diagnosis not present

## 2019-05-13 DIAGNOSIS — G5601 Carpal tunnel syndrome, right upper limb: Secondary | ICD-10-CM

## 2019-05-13 DIAGNOSIS — M25531 Pain in right wrist: Secondary | ICD-10-CM | POA: Diagnosis not present

## 2019-05-13 NOTE — Progress Notes (Signed)
Office Visit Note   Patient: Kathryn [name]           Date of Birth: 1966/03/26           MRN: [id number] Visit Date: 05/13/2019              Requested by: Kathryn PandaMillsaps, Kimberly, NP [address] [place] [id number] PCP: Kathryn PandaMillsaps, Kimberly, NP   Assessment & Plan: Visit Diagnoses:  1. Acute pain of left knee   2. Pain in right wrist   3. 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  Recommend relative rest avoiding deep squats and lunges.  Over-the-counter NSAIDs.  Possible injection in this area.  Pain persist may consider MRI rule out this is possibly loose body.  In regards to her moderate carpal tunnel syndrome on the right recommend carpal tunnel release.  She like to proceed with this near future.  She is given Kathryn [name] card she will talk to her husband and then call us if she like to proceed with with carpal tunnel release later this summer.  Did discuss risk benefits also discussed risk of continuing conservative treatment which could cause permanent nerve damage.  Discussed with her postoperative protocol.  She will follow-up 2 weeks postop or as needed.  Follow-Up Instructions: Return for  2 weeks postop.   Orders:  Orders Placed This Encounter  Procedures  . XR Knee 1-2 Views Left   No orders of the defined types were placed in this encounter.     Procedures: No procedures performed   Clinical Data: No additional findings.   Subjective: Chief Complaint  Patient presents with  . Follow-up    HPI Kathryn [name] returns today to go over her EMG nerve conduction studies.  She is found to have moderate right median nerve entrapment i.e. carpal tunnel syndrome.  She states that she cannot hold anything without her hand going numb at this point.  She would like to get this fixed at some point time.  Still does have some pain  into her fifth finger which would not be consistent with carpal tunnel syndrome.  She states she is just unsure that she know she at least has numbness tingling involving the thumb through the ring finger. Patient also asked about her left knee she has been doing squats and lunges since having pain anterior lateral aspect of the lower thigh.  She states it feels as if something moves within her knee she states she can palpate something deep within the knee.  Occasionally catches.  She had no new injury. Review of Systems See HPI otherwise negative  Objective: Vital Signs: There were no vitals taken for this visit.  Physical Exam General well-developed well-nourished female no acute distress Ortho Exam Left knee full range of motion.  Slight patellofemoral crepitus.  Palpable nodule just lateral to the anterior proximal portion of the patella.  No abnormal warmth erythema or effusion. Specialty Comments:  No specialty comments available.  Imaging: Xr Knee 1-2 Views Left  Result Date: 05/13/2019  Left knee AP and lateral views: No acute fracture.  Medial lateral joint lines overall well-preserved.  Mild patellofemoral arthritic changes.  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  No other bony abnormalities.    PMFS History: There are no active problems to display for this patient.  Past Medical History:  Diagnosis Date  . Allergy   . Anemia    with pregnancy   . Anxiety   . GERD (gastroesophageal reflux disease)    mild- prn tums     Family History  Problem Relation Age of Onset  . Stomach cancer Maternal Grandfather   . Cervical cancer Mother   . Colon cancer Neg Hx   . Colon polyps Neg Hx   . Rectal cancer Neg Hx     Past Surgical History:  Procedure Laterality Date  . CESAREAN SECTION    . CHOLECYSTECTOMY     Social History   Occupational History  . Not on file  Tobacco Use  . Smoking status: Former Research scientist (life sciences)  . Smokeless tobacco: Never Used   Substance and Sexual Activity  . Alcohol use: Yes    Comment: very rare  . Drug use: No  . Sexual activity: Not on file

## 2019-06-10 ENCOUNTER — Other Ambulatory Visit: Payer: Self-pay | Admitting: Orthopaedic Surgery

## 2019-06-10 DIAGNOSIS — G5601 Carpal tunnel syndrome, right upper limb: Secondary | ICD-10-CM | POA: Diagnosis not present

## 2019-06-10 MED ORDER — HYDROCODONE-ACETAMINOPHEN 5-325 MG PO TABS: 1.0000 | ORAL_TABLET | Freq: Four times a day (QID) | ORAL | 0 refills | Status: DC | PRN

## 2019-06-17 ENCOUNTER — Telehealth: Payer: Self-pay | Admitting: Orthopaedic Surgery

## 2019-06-17 NOTE — Telephone Encounter (Signed)
Patient called and stated that she had surgery on 06/10/19. Nothing on chart or on her discharge papers about the when her post op was.  Please call patient to advise.  [phone]

## 2019-06-17 NOTE — Telephone Encounter (Signed)
2 weeks post op, sometime next week is fine

## 2019-06-23 ENCOUNTER — Ambulatory Visit (INDEPENDENT_AMBULATORY_CARE_PROVIDER_SITE_OTHER): Payer: BC Managed Care – PPO | Admitting: Orthopaedic Surgery

## 2019-06-23 ENCOUNTER — Encounter: Payer: Self-pay | Admitting: Orthopaedic Surgery

## 2019-06-23 ENCOUNTER — Other Ambulatory Visit: Payer: Self-pay

## 2019-06-23 DIAGNOSIS — Z9889 Other specified postprocedural states: Secondary | ICD-10-CM

## 2019-06-23 NOTE — Progress Notes (Signed)
The patient is now 13 days status post a right open carpal tunnel release.  She is doing well overall.  She has had a little drainage from her incision before but today looks good.  She is had a bandage off about 6 days.  Examination her sutures actually gets her move them in place Steri-Strips.  She has intact motor function of her hand and she says that her sensation is improved dramatically and she denies any significant numbness and tingling.  She does report pillar pain.  I did describe to her how to take care of her wound.  She will of the Steri-Strips fall off and then can place Vaseline if needed.  She can get back to activities as comfort allows but no heavy gripping.  I would like to see her back in 4 weeks see how she is doing overall.  All question concerns were answered and addressed.

## 2019-07-21 ENCOUNTER — Ambulatory Visit (INDEPENDENT_AMBULATORY_CARE_PROVIDER_SITE_OTHER): Payer: BC Managed Care – PPO | Admitting: Orthopaedic Surgery

## 2019-07-21 ENCOUNTER — Encounter: Payer: Self-pay | Admitting: Orthopaedic Surgery

## 2019-07-21 DIAGNOSIS — Z9889 Other specified postprocedural states: Secondary | ICD-10-CM

## 2019-07-21 NOTE — Progress Notes (Signed)
The patient is now about 6 weeks or more status post a right carpal tunnel release.  This is her dominant hand.  She reports that she is doing well.  There is still some pain in the palm but she says her numbness and tingling has resolved.  On exam her incision looks great.  There is no muscle atrophy.  She can make a full composite fist.  She definitely has pain at the incision site which is to be expected for up to 3 months.  She is still significantly weak with grip and pinch strength.  At this point all question concerns were answered addressed and we talked about follow-up as needed since she is doing well.  She understands he can still take 6 months to a year to completely recover from surgery but she is doing great.  If she has any issues at all she will let us know.

## 2019-09-06 ENCOUNTER — Other Ambulatory Visit: Payer: Self-pay | Admitting: *Deleted

## 2019-09-06 DIAGNOSIS — Z1231 Encounter for screening mammogram for malignant neoplasm of breast: Secondary | ICD-10-CM

## 2019-10-20 ENCOUNTER — Ambulatory Visit
Admission: RE | Admit: 2019-10-20 | Discharge: 2019-10-20 | Disposition: A | Discharge status: Home / Self Care | Payer: BC Managed Care – PPO | Source: Ambulatory Visit | Attending: *Deleted | Admitting: *Deleted

## 2019-10-20 ENCOUNTER — Other Ambulatory Visit: Payer: Self-pay

## 2019-10-20 DIAGNOSIS — Z1231 Encounter for screening mammogram for malignant neoplasm of breast: Secondary | ICD-10-CM

## 2020-09-21 ENCOUNTER — Other Ambulatory Visit: Payer: Self-pay | Admitting: *Deleted

## 2020-09-21 DIAGNOSIS — Z1231 Encounter for screening mammogram for malignant neoplasm of breast: Secondary | ICD-10-CM

## 2020-10-24 ENCOUNTER — Ambulatory Visit
Admission: RE | Admit: 2020-10-24 | Discharge: 2020-10-24 | Disposition: A | Discharge status: Home / Self Care | Payer: BC Managed Care – PPO | Source: Ambulatory Visit | Attending: *Deleted | Admitting: *Deleted

## 2020-10-24 ENCOUNTER — Other Ambulatory Visit: Payer: Self-pay

## 2020-10-24 DIAGNOSIS — Z1231 Encounter for screening mammogram for malignant neoplasm of breast: Secondary | ICD-10-CM

## 2021-09-11 DIAGNOSIS — J209 Acute bronchitis, unspecified: Secondary | ICD-10-CM | POA: Diagnosis not present

## 2021-09-11 DIAGNOSIS — B349 Viral infection, unspecified: Secondary | ICD-10-CM | POA: Diagnosis not present

## 2021-09-11 DIAGNOSIS — J019 Acute sinusitis, unspecified: Secondary | ICD-10-CM | POA: Diagnosis not present

## 2021-12-07 ENCOUNTER — Other Ambulatory Visit: Payer: Self-pay | Admitting: *Deleted

## 2021-12-07 DIAGNOSIS — Z1231 Encounter for screening mammogram for malignant neoplasm of breast: Secondary | ICD-10-CM

## 2021-12-13 IMAGING — MG DIGITAL SCREENING BILAT W/ TOMO W/ CAD
8 series · 9 of 24 positions shown · non-contrast
Comparison: Previous exam(s).

CLINICAL DATA: Screening.

EXAM:
DIGITAL SCREENING BILATERAL MAMMOGRAM WITH TOMO AND CAD

[L MLO synth-2D]
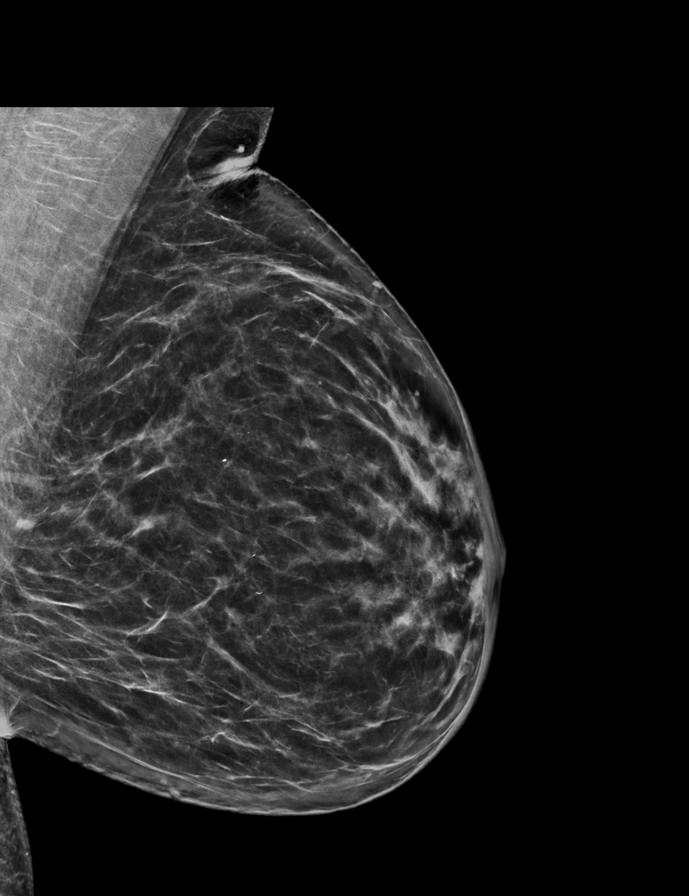

[L CC synth-2D]
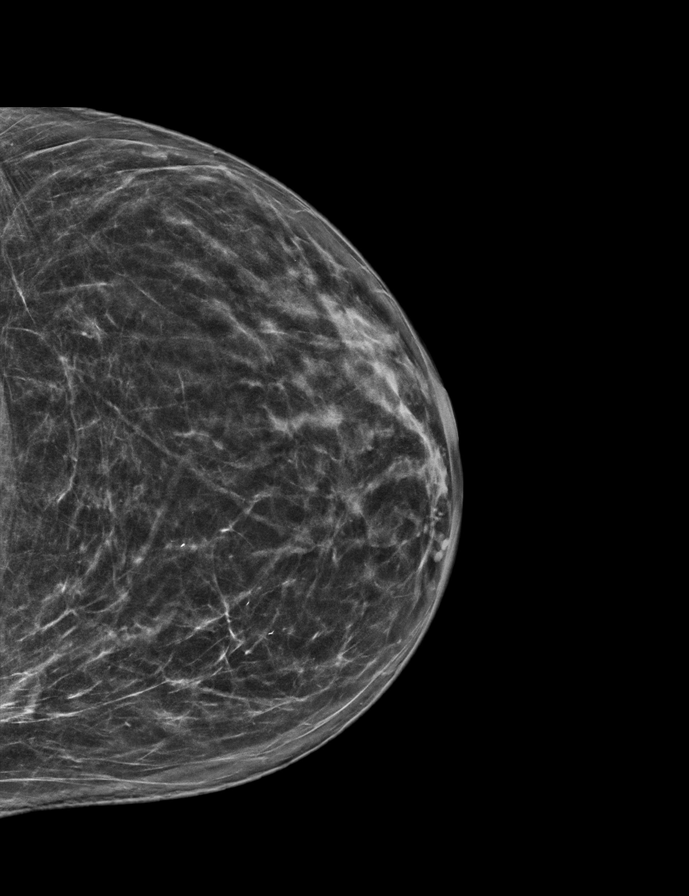

[R CC synth-2D]
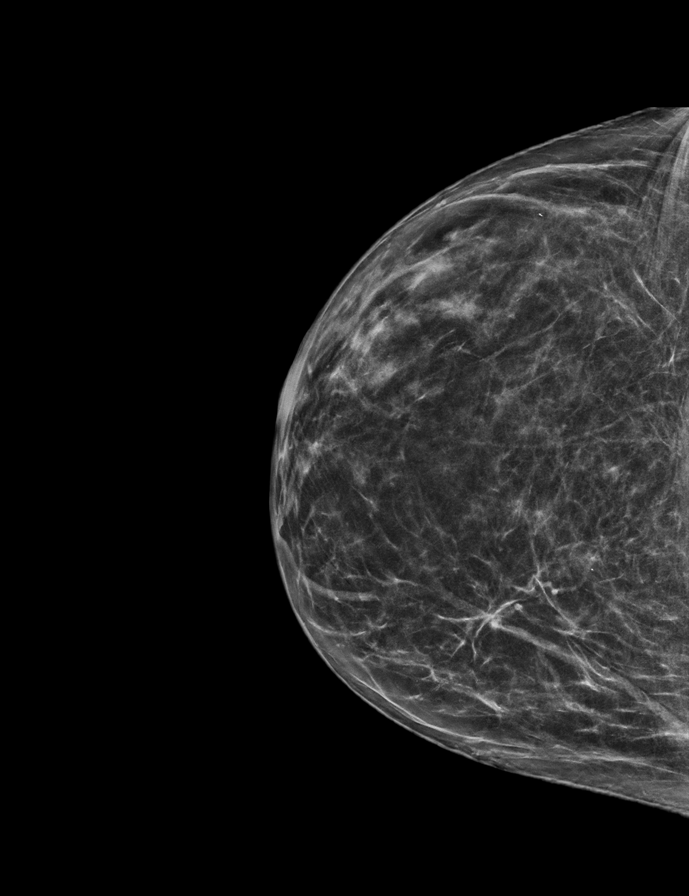

[R MLO synth-2D]
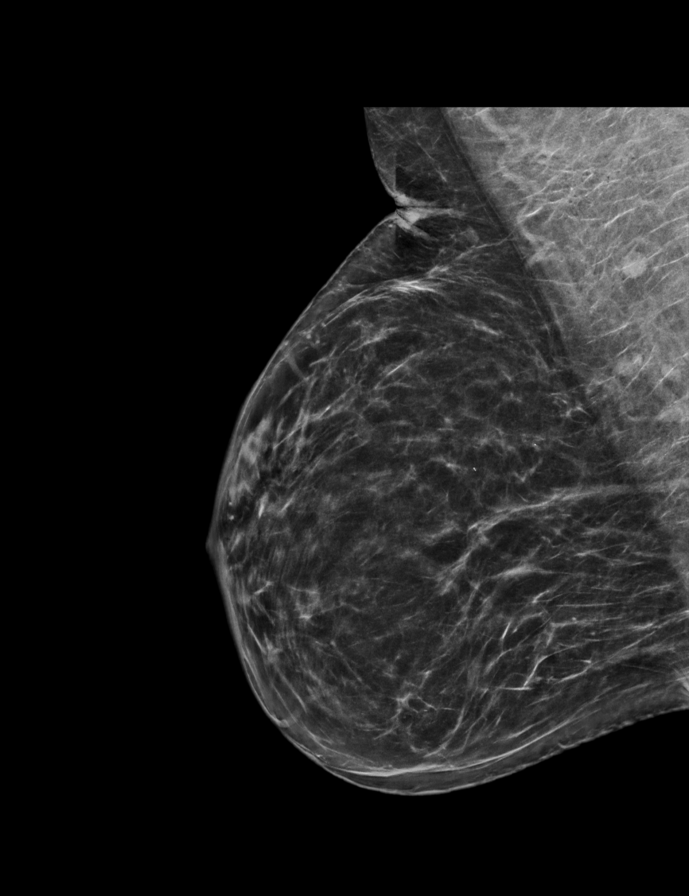

[R MLO tomo · 2 of 66 frames shown]
[frame 22/66]
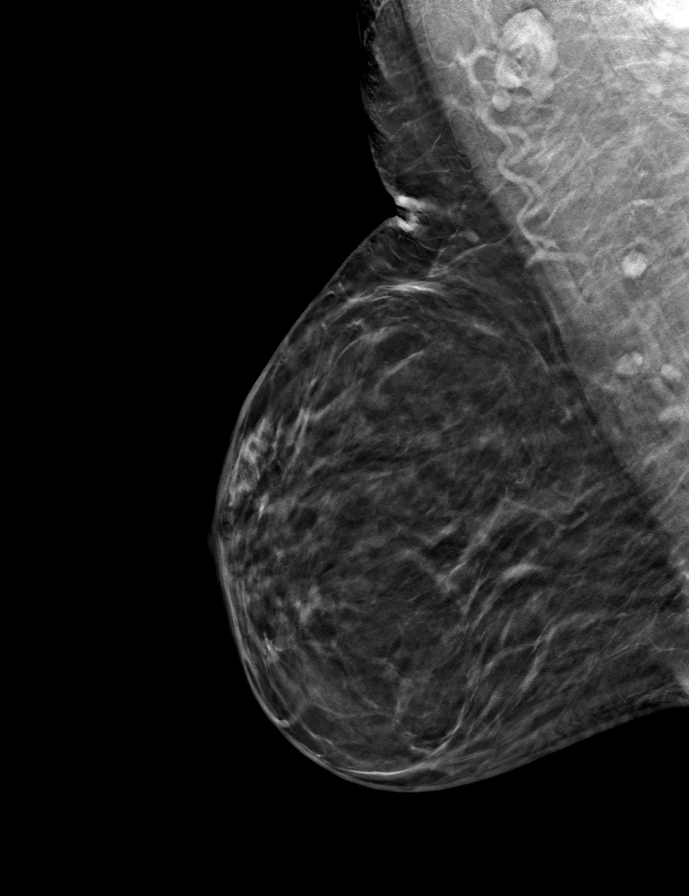
[frame 33/66]
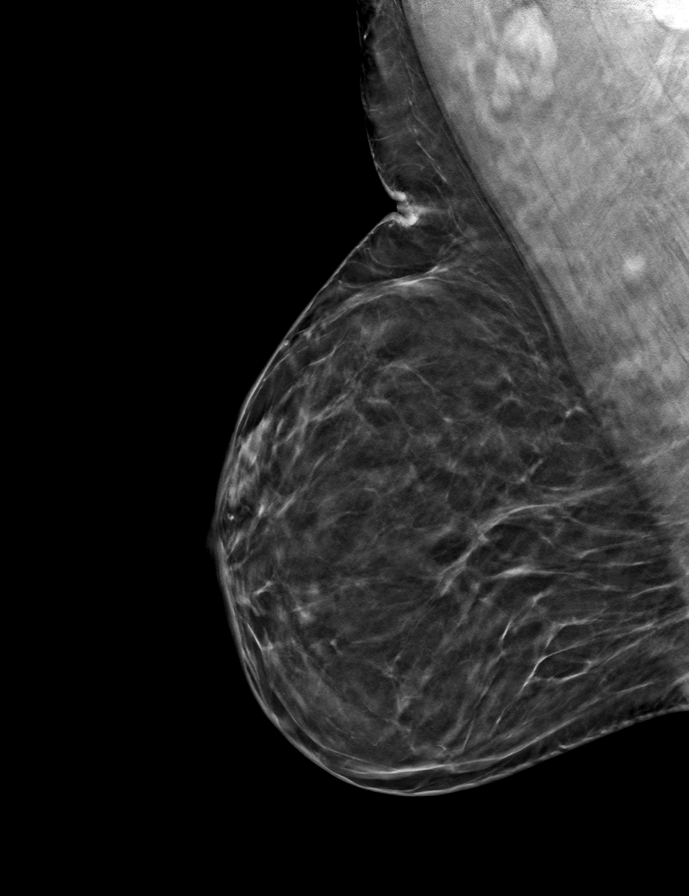

[L CC tomo · tomo slice 31/62.0]
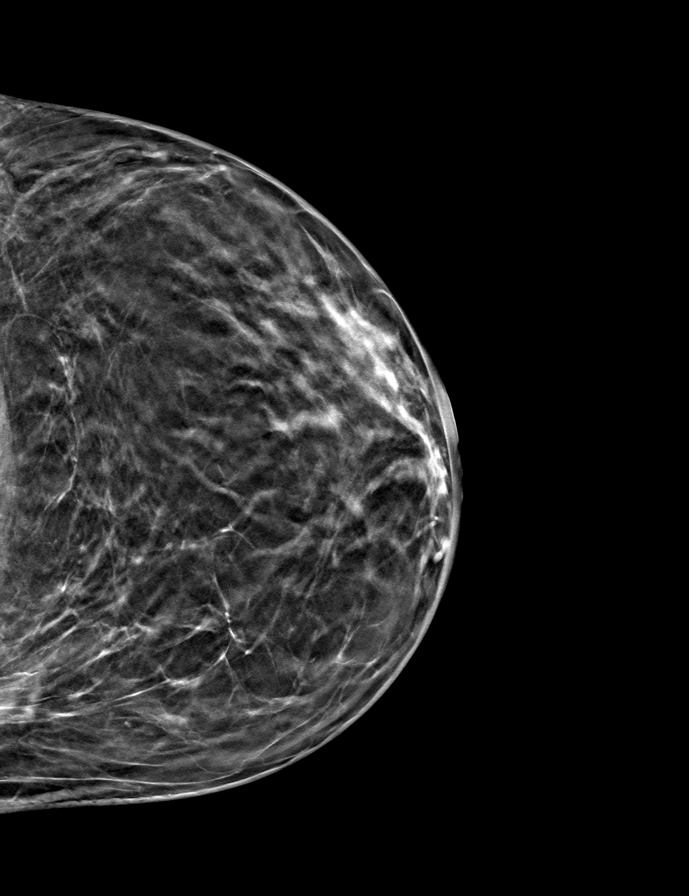

[L MLO tomo · tomo slice 35/69.0]
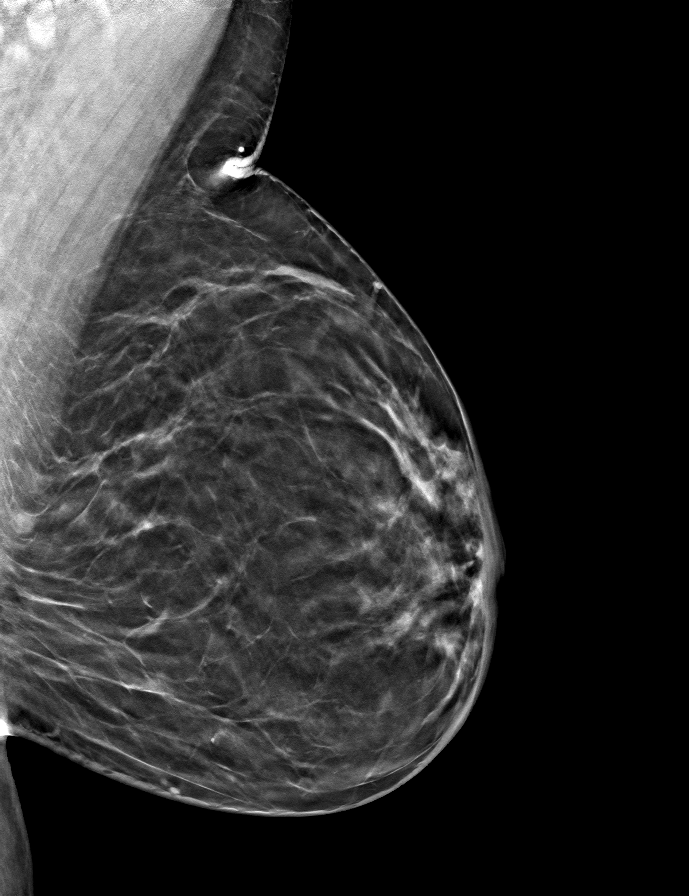

[R CC tomo · tomo slice 30/59.0]
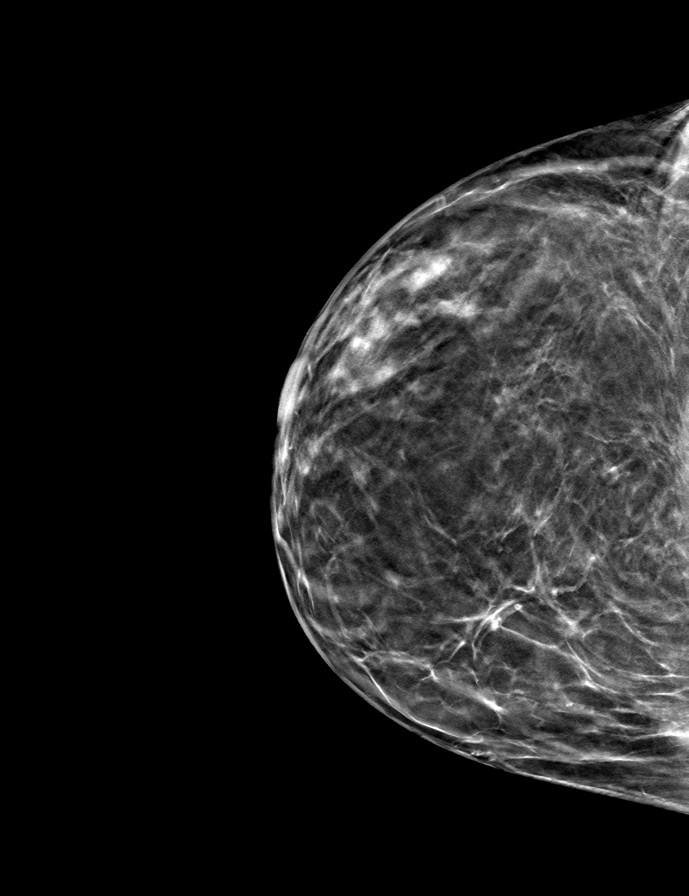

[9 of 24 positions shown; findings below may reference images not displayed]

ACR Breast Density Category b: There are scattered areas of
fibroglandular density.
FINDINGS: There are no findings suspicious for malignancy. Images were
processed with CAD.
IMPRESSION: No mammographic evidence of malignancy. A result letter of this
screening mammogram will be mailed directly to the patient.

RECOMMENDATION:
Screening mammogram in one year. (Code:[id number])

BI-RADS CATEGORY  1: Negative.

## 2021-12-28 ENCOUNTER — Ambulatory Visit: Payer: BC Managed Care – PPO

## 2021-12-28 ENCOUNTER — Ambulatory Visit
Admission: RE | Admit: 2021-12-28 | Discharge: 2021-12-28 | Disposition: A | Discharge status: Home / Self Care | Payer: BC Managed Care – PPO | Source: Ambulatory Visit | Attending: *Deleted | Admitting: *Deleted

## 2021-12-28 DIAGNOSIS — Z1231 Encounter for screening mammogram for malignant neoplasm of breast: Secondary | ICD-10-CM

## 2022-04-20 DIAGNOSIS — J0100 Acute maxillary sinusitis, unspecified: Secondary | ICD-10-CM | POA: Diagnosis not present

## 2022-07-16 DIAGNOSIS — Z0001 Encounter for general adult medical examination with abnormal findings: Secondary | ICD-10-CM | POA: Diagnosis not present

## 2022-07-16 DIAGNOSIS — Z79899 Other long term (current) drug therapy: Secondary | ICD-10-CM | POA: Diagnosis not present

## 2022-12-06 ENCOUNTER — Other Ambulatory Visit: Payer: Self-pay | Admitting: *Deleted

## 2022-12-06 DIAGNOSIS — Z1231 Encounter for screening mammogram for malignant neoplasm of breast: Secondary | ICD-10-CM

## 2023-01-24 ENCOUNTER — Ambulatory Visit
Admission: RE | Admit: 2023-01-24 | Discharge: 2023-01-24 | Disposition: A | Discharge status: Home / Self Care | Payer: BC Managed Care – PPO | Source: Ambulatory Visit | Attending: *Deleted | Admitting: *Deleted

## 2023-01-24 DIAGNOSIS — Z1231 Encounter for screening mammogram for malignant neoplasm of breast: Secondary | ICD-10-CM | POA: Diagnosis not present

## 2023-04-23 DIAGNOSIS — J209 Acute bronchitis, unspecified: Secondary | ICD-10-CM | POA: Diagnosis not present

## 2023-04-23 DIAGNOSIS — M25562 Pain in left knee: Secondary | ICD-10-CM | POA: Diagnosis not present

## 2023-04-23 DIAGNOSIS — F418 Other specified anxiety disorders: Secondary | ICD-10-CM | POA: Diagnosis not present

## 2023-04-23 DIAGNOSIS — M25561 Pain in right knee: Secondary | ICD-10-CM | POA: Diagnosis not present

## 2023-06-16 ENCOUNTER — Encounter (INDEPENDENT_AMBULATORY_CARE_PROVIDER_SITE_OTHER): Payer: BC Managed Care – PPO | Admitting: Physician Assistant

## 2023-06-18 ENCOUNTER — Ambulatory Visit (INDEPENDENT_AMBULATORY_CARE_PROVIDER_SITE_OTHER): Payer: BC Managed Care – PPO | Admitting: Adult Health

## 2023-06-18 ENCOUNTER — Encounter (INDEPENDENT_AMBULATORY_CARE_PROVIDER_SITE_OTHER): Payer: Self-pay | Admitting: Adult Health

## 2023-06-18 VITALS — BP 119/80 | HR 63 | Temp 98.5°F | Ht 64.0 in | Wt 184.0 lb

## 2023-06-18 DIAGNOSIS — E669 Obesity, unspecified: Secondary | ICD-10-CM

## 2023-06-18 DIAGNOSIS — R5383 Other fatigue: Secondary | ICD-10-CM

## 2023-06-18 DIAGNOSIS — Z0000 Encounter for general adult medical examination without abnormal findings: Secondary | ICD-10-CM

## 2023-06-18 DIAGNOSIS — Z0289 Encounter for other administrative examinations: Secondary | ICD-10-CM

## 2023-06-18 DIAGNOSIS — Z6831 Body mass index (BMI) 31.0-31.9, adult: Secondary | ICD-10-CM

## 2023-06-18 NOTE — Progress Notes (Signed)
Office: [phone]  /  Fax: [phone]   Initial Visit  Kathryn [name] was seen in clinic today to evaluate for obesity. She is interested in losing weight to improve overall health and reduce the risk of weight related complications. She presents today to review program treatment options, initial physical assessment, and evaluation.     She was referred by: Friend or Family  When asked what else they would like to accomplish? She states: Adopt healthier eating patterns, Improve energy levels and physical activity, Improve quality of life, and Improve appearance  Weight history: She feels that she has "always been the heaviest of all her friends".  When asked how has your weight affected you? She states: Contributed to medical problems, Having fatigue, Having poor endurance, and Problems with eating patterns  Some associated conditions: Hypertension  Contributing factors: Family history, Nutritional, Reduced physical activity, and Menopause  Weight promoting medications identified: Contraceptives or hormonal therapy  Current nutrition plan: Increasing Fruits/Vegetables in daily diet  Current level of physical activity: Walking, Strength training, and NEAT  Current or previous pharmacotherapy: None  Currently on Wellbutrin XL 150mg  - mood stablization  Response to medication: Never tried medications   Past medical history includes:   Past Medical History:  Diagnosis Date   Allergy    Anemia    with pregnancy    Anxiety    GERD (gastroesophageal reflux disease)    mild- prn tums      Objective:   BP 119/80   Pulse 63   Temp 98.5 F (36.9 C)   Ht 5\' 4"  (1.626 m)   Wt 184 lb (83.5 kg)   SpO2 99%   BMI 31.58 kg/m  She was weighed on the bioimpedance scale: Body mass index is 31.58 kg/m.  Peak GNFAOZ:308- with pregnancy , Body Fat%:36.8, Visceral Fat Rating:10, Weight trend over the last 12 months: Increasing  General:  Alert, oriented and cooperative.  Patient is in no acute distress.  Respiratory: Normal respiratory effort, no problems with respiration noted   Gait: able to ambulate independently  Mental Status: Normal mood and affect. Normal behavior. Normal judgment and thought content.   DIAGNOSTIC DATA REVIEWED:  BMET No results found for: "NA", "K", "CL", "CO2", "GLUCOSE", "BUN", "CREATININE", "CALCIUM", "GFRNONAA", "GFRAA" No results found for: "HGBA1C" No results found for: "INSULIN" CBC No results found for: "WBC", "RBC", "HGB", "HCT", "PLT", "MCV", "MCH", "MCHC", "RDW" Iron/TIBC/Ferritin/ %Sat No results found for: "IRON", "TIBC", "FERRITIN", "IRONPCTSAT" Lipid Panel  No results found for: "CHOL", "TRIG", "HDL", "CHOLHDL", "VLDL", "LDLCALC", "LDLDIRECT" Hepatic Function Panel  No results found for: "PROT", "ALBUMIN", "AST", "ALT", "ALKPHOS", "BILITOT", "BILIDIR", "IBILI" No results found for: "TSH"   Assessment and Plan:   Healthcare maintenance  Obesity (BMI 30-39.9), Starting BMI 31.7   ESTABLISH WITH [facility]     Obesity Treatment / Action Plan:  Patient will work on garnering support from family and friends to begin weight loss journey. Will work on eliminating or reducing the presence of highly palatable, calorie dense foods in the home. Will complete provided nutritional and psychosocial assessment questionnaire before the next appointment. Will be scheduled for indirect calorimetry to determine resting energy expenditure in a fasting state.  This will allow [place] to create a reduced calorie, high-protein meal plan to promote loss of fat mass while preserving muscle mass. Counseled on the health benefits of losing 5%-15% of total body weight. Was counseled on nutritional approaches to weight loss and benefits of reducing processed foods and consuming plant-based  foods and high quality protein as part of nutritional weight management. Was counseled on pharmacotherapy and role as an adjunct in weight management.    Obesity Education Performed Today:  She was weighed on the bioimpedance scale and results were discussed and documented in the synopsis.  We discussed obesity as a disease and the importance of a more detailed evaluation of all the factors contributing to the disease.  We discussed the importance of long term lifestyle changes which include nutrition, exercise and behavioral modifications as well as the importance of customizing this to her specific health and social needs.  We discussed the benefits of reaching a healthier weight to alleviate the symptoms of existing conditions and reduce the risks of the biomechanical, metabolic and psychological effects of obesity.  [name] appears to be in the action stage of change and states they are ready to start intensive lifestyle modifications and behavioral modifications.  30 minutes was spent today on this visit including the above counseling, pre-visit chart review, and post-visit documentation.  Reviewed by clinician on day of visit: allergies, medications, problem list, medical history, surgical history, family history, social history, and previous encounter notes pertinent to obesity diagnosis.  Jonah Gingras d. Jakorian Marengo, NP-C

## 2023-07-16 ENCOUNTER — Ambulatory Visit (INDEPENDENT_AMBULATORY_CARE_PROVIDER_SITE_OTHER): Payer: BC Managed Care – PPO | Admitting: Family Medicine

## 2023-07-16 ENCOUNTER — Encounter (INDEPENDENT_AMBULATORY_CARE_PROVIDER_SITE_OTHER): Payer: Self-pay | Admitting: Family Medicine

## 2023-07-16 VITALS — BP 123/76 | HR 69 | Temp 98.2°F | Ht 64.0 in | Wt 182.0 lb

## 2023-07-16 DIAGNOSIS — Z8632 Personal history of gestational diabetes: Secondary | ICD-10-CM

## 2023-07-16 DIAGNOSIS — R5383 Other fatigue: Secondary | ICD-10-CM | POA: Diagnosis not present

## 2023-07-16 DIAGNOSIS — R0602 Shortness of breath: Secondary | ICD-10-CM

## 2023-07-16 DIAGNOSIS — E669 Obesity, unspecified: Secondary | ICD-10-CM | POA: Diagnosis not present

## 2023-07-16 DIAGNOSIS — Z6831 Body mass index (BMI) 31.0-31.9, adult: Secondary | ICD-10-CM | POA: Diagnosis not present

## 2023-07-16 DIAGNOSIS — R7989 Other specified abnormal findings of blood chemistry: Secondary | ICD-10-CM

## 2023-07-16 DIAGNOSIS — E559 Vitamin D deficiency, unspecified: Secondary | ICD-10-CM

## 2023-07-16 DIAGNOSIS — F32A Depression, unspecified: Secondary | ICD-10-CM

## 2023-07-16 DIAGNOSIS — Z1331 Encounter for screening for depression: Secondary | ICD-10-CM

## 2023-07-16 DIAGNOSIS — E66811 Obesity, class 1: Secondary | ICD-10-CM

## 2023-07-16 NOTE — Progress Notes (Unsigned)
Chief Complaint:   OBESITY Kathryn [name] (MR# [id number]) is a 57 y.o. female who presents for evaluation and treatment of obesity and related comorbidities. Current BMI is Body mass index is 31.24 kg/m. Kathryn [name] has been struggling with her weight for many years and has been unsuccessful in either losing weight, maintaining weight loss, or reaching her healthy weight goal.  Kathryn [name] is currently in the action stage of change and ready to dedicate time achieving and maintaining a healthier weight. Kathryn [name] is interested in becoming our patient and working on intensive lifestyle modifications including (but not limited to) diet and exercise for weight loss.  Referred by a friend. Has yo yoed with her weight consistently over many years.  This is the first summer patient did not gain up to the 200lb mark.  She feels she does better on structure and schedule.  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  Works 50 hours or more a week. Lives at home with her husband Kathryn [name], and nephew Kathryn [name] (age 58).  She mentions that family is supportive of her sometimes and they eat dinner together at dinner on weeknights.  She does anticipate sabotage from her husband by bringing in indulgent food around her.  Previously did Noom and felt she did well with tracking food.  Eats fast food or take out 2x a week.  Husband is doing keto currently.  Food Recall: Yogurt (greek) with blueberries sometimes.  Lunch is salad from home with green leaf lettuce, squash, tomatoes and occasionally with dressing.  May have an apple as well.  Around 4pm she eats a protein bar (21g of protein), or may eat 3 candy bars or handful of starbursts.  Dinner around 6:30pm maybe 1 thigh of friend chicken, vegetables with butter (1/3 cup).  Doesn't normally eat after supper.   Kathryn [name]'s habits were reviewed today and are as follows: Her family eats meals together, she struggles with family and or coworkers weight  loss sabotage, her desired weight loss is 32+ lbs, she has been heavy most of her life, she started gaining weight with all her pregnancies (1990, 1996, 1999), her heaviest weight ever was 198 pounds, she has significant food cravings issues, she snacks frequently in the evenings, she skips meals frequently, she is frequently drinking liquids with calories, she frequently makes poor food choices, she has problems with excessive hunger, she frequently eats larger portions than normal, and she struggles with emotional eating.  Depression Screen Kathryn [name]'s Food and Mood (modified PHQ-9) score was 15.  Subjective:   1. Other fatigue Kathryn [name] admits to daytime somnolence and admits to waking up still tired. Patient has a history of symptoms of daytime fatigue, morning fatigue, and morning headache. Kathryn [name] generally gets 5 or 7 hours of sleep per night, and states that she has nightime awakenings. Snoring is present. Apneic episodes are not present. Epworth Sleepiness Score is 10.  EKG-normal sinus rhythm at 65 bpm.  2. SOBOE (shortness of breath on exertion) Kathryn [name] notes increasing shortness of breath with exercising and seems to be worsening over time with weight gain. She notes getting out of breath sooner with activity than she used to. This has not gotten worse recently. Kathryn [name] denies shortness of breath at rest or orthopnea.  3. Elevated serum creatinine Patient's creatinine was 1.10 on her last lab check.  She was previously on many OTC supplements.  4. Vitamin D deficiency Patient was previously on vitamin D and her level was  of 65.  She notes fatigue.  5. H/O gestational diabetes mellitus, not currently pregnant Patient has no A1c in Epic.  She has had this diagnosis in all her pregnancies.  Assessment/Plan:   1. Other fatigue Kathryn [name] does feel that her weight is causing her energy to be lower than it should be. Fatigue may be related to obesity, depression or many other causes.  Labs will be ordered, and in the meanwhile, Kathryn [name] will focus on self care including making healthy food choices, increasing physical activity and focusing on stress reduction.  - EKG 12-Lead - Vitamin B12 - Folate - TSH - T4, free - T3  2. SOBOE (shortness of breath on exertion) Kathryn [name] does feel that she gets out of breath more easily that she used to when she exercises. Kathryn [name]'s shortness of breath appears to be obesity related and exercise induced. She has agreed to work on weight loss and gradually increase exercise to treat her exercise induced shortness of breath. Will continue to monitor closely.  - CBC with Differential/Platelet - Lipid Panel With LDL/HDL Ratio  3. Elevated serum creatinine We will check labs today, and we will follow-up at patient's next appointment.  - Comprehensive metabolic panel  4. Vitamin D deficiency We will check labs today, and we will follow-up at patient's next appointment.  - VITAMIN D 25 Hydroxy (Vit-D Deficiency, Fractures)  5. H/O gestational diabetes mellitus, not currently pregnant We will check labs today, and we will follow-up at patient's next appointment.  - Hemoglobin A1c - Insulin, random  6. Depression screening Kathryn [name] had a positive depression screening. Depression is commonly associated with obesity and often results in emotional eating behaviors. We will monitor this closely and work on CBT to help improve the non-hunger eating patterns. Referral to Psychology may be required if no improvement is seen as she continues in our clinic.  7. BMI 31.0-31.9,adult  8. Obesity with starting BMI of 31.4 Kathryn [name] is currently in the action stage of change and her goal is to continue with weight loss efforts. I recommend Kathryn [name] begin the structured treatment plan as follows:  She has agreed to the Category 2 Plan.  Exercise goals: As is.  Patient is already exercising.  Behavioral modification strategies: increasing lean  protein intake, meal planning and cooking strategies, keeping healthy foods in the home, and planning for success.  She was informed of the importance of frequent follow-up visits to maximize her success with intensive lifestyle modifications for her multiple health conditions. She was informed we would discuss her lab results at her next visit unless there is a critical issue that needs to be addressed sooner. Kathryn [name] agreed to keep her next visit at the agreed upon time to discuss these results.  Objective:   Blood pressure 123/76, pulse 69, temperature 98.2 F (36.8 C), height 5\' 4"  (1.626 m), weight 182 lb (82.6 kg), SpO2 100%. Body mass index is 31.24 kg/m.  EKG: Normal sinus rhythm, rate 65 BPM.  Indirect Calorimeter completed today shows a VO2 of 212 and a REE of 1454.  Her calculated basal metabolic rate is 1610 thus her basal metabolic rate is worse than expected.  General: Cooperative, alert, well developed, in no acute distress. HEENT: Conjunctivae and lids unremarkable. Cardiovascular: Regular rhythm.  Lungs: Normal work of breathing. Neurologic: No focal deficits.   Lab Results  Component Value Date   CREATININE 1.02 (H) 07/16/2023   BUN 12 07/16/2023   NA 138 07/16/2023   K 5.1 07/16/2023   CL  102 07/16/2023   CO2 23 07/16/2023   Lab Results  Component Value Date   ALT 19 07/16/2023   AST 23 07/16/2023   ALKPHOS 66 07/16/2023   BILITOT 0.4 07/16/2023   Lab Results  Component Value Date   HGBA1C 5.5 07/16/2023   Lab Results  Component Value Date   INSULIN 9.1 07/16/2023   Lab Results  Component Value Date   TSH 1.330 07/16/2023   Lab Results  Component Value Date   CHOL 191 07/16/2023   HDL 79 07/16/2023   LDLCALC 98 07/16/2023   TRIG 75 07/16/2023   Lab Results  Component Value Date   WBC 6.4 07/16/2023   HGB 14.0 07/16/2023   HCT 41.8 07/16/2023   MCV 84 07/16/2023   PLT 269 07/16/2023   No results found for: "IRON", "TIBC",  "FERRITIN"  Attestation Statements:   Reviewed by clinician on day of visit: allergies, medications, problem list, medical history, surgical history, family history, social history, and previous encounter notes.  Time spent on visit including pre-visit chart review and post-visit charting and care was 45 minutes.   I, Burt Knack, am acting as transcriptionist for Reuben Likes, MD.  This is the patient's first visit at [facility]. The patient's NEW PATIENT PACKET was reviewed at length. Included in the packet: current and past health history, medications, allergies, ROS, gynecologic history (women only), surgical history, family history, social history, weight history, weight loss surgery history (for those that have had weight loss surgery), nutritional evaluation, mood and food questionnaire, PHQ9, Epworth questionnaire, sleep habits questionnaire, patient life and health improvement goals questionnaire. These will all be scanned into the patient's chart under media.   During the visit, I independently reviewed the patient's EKG, bioimpedance scale results, and indirect calorimeter results. I used this information to tailor a meal plan for the patient that will help her to lose weight and will improve her obesity-related conditions going forward. I performed a medically necessary appropriate examination and/or evaluation. I discussed the assessment and treatment plan with the patient. The patient was provided an opportunity to ask questions and all were answered. The patient agreed with the plan and demonstrated an understanding of the instructions. Labs were ordered at this visit and will be reviewed at the next visit unless more critical results need to be addressed immediately. Clinical information was updated and documented in the EMR.    I have reviewed the above documentation for accuracy and completeness, and I agree with the above. - Reuben Likes, MD

## 2023-07-17 LAB — HEMOGLOBIN A1C
Est. average glucose Bld gHb Est-mCnc: 111 mg/dL
Hgb A1c MFr Bld: 5.5 % (ref 4.8–5.6)

## 2023-07-17 LAB — LIPID PANEL WITH LDL/HDL RATIO
Cholesterol, Total: 191 mg/dL (ref 100–199)
HDL: 79 mg/dL (ref >39)
LDL Chol Calc (NIH): 98 mg/dL (ref 0–99)
LDL/HDL Ratio: 1.2 ratio (ref 0.0–3.2)
Triglycerides: 75 mg/dL (ref 0–149)
VLDL Cholesterol Cal: 14 mg/dL (ref 5–40)

## 2023-07-17 LAB — CBC WITH DIFFERENTIAL/PLATELET
Basophils Absolute: 0.1 10*3/uL (ref 0.0–0.2)
Basos: 1 %
EOS (ABSOLUTE): 0.1 10*3/uL (ref 0.0–0.4)
Eos: 1 %
Hematocrit: 41.8 % (ref 34.0–46.6)
Hemoglobin: 14 g/dL (ref 11.1–15.9)
Immature Grans (Abs): 0 10*3/uL (ref 0.0–0.1)
Immature Granulocytes: 0 %
Lymphocytes Absolute: 1.9 10*3/uL (ref 0.7–3.1)
Lymphs: 30 %
MCH: 28.1 pg (ref 26.6–33.0)
MCHC: 33.5 g/dL (ref 31.5–35.7)
MCV: 84 fL (ref 79–97)
Monocytes Absolute: 0.4 10*3/uL (ref 0.1–0.9)
Monocytes: 6 %
Neutrophils Absolute: 4 10*3/uL (ref 1.4–7.0)
Neutrophils: 62 %
Platelets: 269 10*3/uL (ref 150–450)
RBC: 4.98 x10E6/uL (ref 3.77–5.28)
RDW: 12.7 % (ref 11.7–15.4)
WBC: 6.4 10*3/uL (ref 3.4–10.8)

## 2023-07-17 LAB — COMPREHENSIVE METABOLIC PANEL
ALT: 19 IU/L (ref 0–32)
AST: 23 IU/L (ref 0–40)
Albumin: 4.3 g/dL (ref 3.8–4.9)
Alkaline Phosphatase: 66 IU/L (ref 44–121)
BUN/Creatinine Ratio: 12 (ref 9–23)
BUN: 12 mg/dL (ref 6–24)
Bilirubin Total: 0.4 mg/dL (ref 0.0–1.2)
CO2: 23 mmol/L (ref 20–29)
Calcium: 9.7 mg/dL (ref 8.7–10.2)
Chloride: 102 mmol/L (ref 96–106)
Creatinine, Ser: 1.02 mg/dL — ABNORMAL HIGH (ref 0.57–1.00)
Globulin, Total: 2.6 g/dL (ref 1.5–4.5)
Glucose: 87 mg/dL (ref 70–99)
Potassium: 5.1 mmol/L (ref 3.5–5.2)
Sodium: 138 mmol/L (ref 134–144)
Total Protein: 6.9 g/dL (ref 6.0–8.5)
eGFR: 64 mL/min/{1.73_m2} (ref >59)

## 2023-07-17 LAB — T3: T3, Total: 181 ng/dL — ABNORMAL HIGH (ref 71–180)

## 2023-07-17 LAB — TSH: TSH: 1.33 u[IU]/mL (ref 0.450–4.500)

## 2023-07-17 LAB — FOLATE: Folate: >20 ng/mL (ref >3.0)

## 2023-07-17 LAB — INSULIN, RANDOM: INSULIN: 9.1 u[IU]/mL (ref 2.6–24.9)

## 2023-07-17 LAB — T4, FREE: Free T4: 1.09 ng/dL (ref 0.82–1.77)

## 2023-07-17 LAB — VITAMIN B12: Vitamin B-12: 671 pg/mL (ref 232–1245)

## 2023-07-17 LAB — VITAMIN D 25 HYDROXY (VIT D DEFICIENCY, FRACTURES): Vit D, 25-Hydroxy: 61.2 ng/mL (ref 30.0–100.0)

## 2023-07-21 ENCOUNTER — Telehealth (INDEPENDENT_AMBULATORY_CARE_PROVIDER_SITE_OTHER): Payer: Self-pay | Admitting: Family Medicine

## 2023-07-21 NOTE — Telephone Encounter (Signed)
The patient indicated that she would be arriving within the hour to retrieve the paperwork from her New Patient appointment. She mentioned that she had lost all her documents, including important meal plans, snack ideas, and grocery lists. Thank you!

## 2023-07-21 NOTE — Telephone Encounter (Signed)
Paperwork handouts are at the front desk 8:20am

## 2023-07-23 DIAGNOSIS — J343 Hypertrophy of nasal turbinates: Secondary | ICD-10-CM | POA: Diagnosis not present

## 2023-07-23 DIAGNOSIS — H9313 Tinnitus, bilateral: Secondary | ICD-10-CM | POA: Diagnosis not present

## 2023-07-23 DIAGNOSIS — H903 Sensorineural hearing loss, bilateral: Secondary | ICD-10-CM | POA: Diagnosis not present

## 2023-07-23 DIAGNOSIS — J310 Chronic rhinitis: Secondary | ICD-10-CM | POA: Diagnosis not present

## 2023-07-31 ENCOUNTER — Ambulatory Visit (INDEPENDENT_AMBULATORY_CARE_PROVIDER_SITE_OTHER): Payer: BC Managed Care – PPO | Admitting: Family Medicine

## 2023-07-31 ENCOUNTER — Encounter (INDEPENDENT_AMBULATORY_CARE_PROVIDER_SITE_OTHER): Payer: Self-pay | Admitting: Family Medicine

## 2023-07-31 VITALS — BP 133/72 | HR 74 | Temp 98.5°F | Ht 64.0 in | Wt 175.0 lb

## 2023-07-31 DIAGNOSIS — Z6830 Body mass index (BMI) 30.0-30.9, adult: Secondary | ICD-10-CM

## 2023-07-31 DIAGNOSIS — E559 Vitamin D deficiency, unspecified: Secondary | ICD-10-CM | POA: Diagnosis not present

## 2023-07-31 DIAGNOSIS — R7989 Other specified abnormal findings of blood chemistry: Secondary | ICD-10-CM

## 2023-07-31 DIAGNOSIS — E669 Obesity, unspecified: Secondary | ICD-10-CM | POA: Diagnosis not present

## 2023-07-31 NOTE — Progress Notes (Unsigned)
Chief Complaint:   OBESITY Kathryn [name] is here to discuss her progress with her obesity treatment plan along with follow-up of her obesity related diagnoses. Kathryn [name] is on the Category 2 Plan and states she is following her eating plan approximately 90% of the time. Kathryn [name] states she is walking 7,000-10,000 steps 7 times per week.    Today's visit was #: 2 Starting weight: 182 lbs Starting date: 07/16/2023 Today's weight: 175 lbs Today's date: 07/31/2023 Total lbs lost to date: 7 Total lbs lost since last in-office visit: 7  Interim History: Patient had a sinus infection had antibiotic and steroid infection has 2 more days of this (has been on it for 8 days).  School started. Difficulty with eating.  With steroid got very hungry.  Has been measuring and weighing all of her food; 6-8 ounces of protein at night is a lot. 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  Had a pizza bar at campus and has been avoiding snacks.  Only has been having issues with mustard-now doing hummus instead-everything else. Difficult to exercise with sickness but remains very active.  Sometimes tired of some protein options-would like to discuss some more meal prep options.  Subjective:   1. Vitamin D deficiency Patient is not on vitamin D, and she notes fatigue.  Her level was >60 on her labs without supplementation.  She was previously on replacement.  I discussed labs with the patient today.  2. Elevated serum creatinine Patient's creatinine was 1.02.  She is not on any medications impacting her creatinine.  I discussed labs with the patient today.  Assessment/Plan:   1. Vitamin D deficiency We will repeat labs in 6 months.  2. Elevated serum creatinine Patient will continue her current plan.  We discussed hydration with a goal of half body weight in ounces of water.  3. BMI 30.0-30.9,adult  4. Obesity with starting BMI of 31.4 Kathryn [name] is currently in the  action stage of change. As such, her goal is to continue with weight loss efforts. She has agreed to the Category 2 Plan.   Exercise goals: All adults should avoid inactivity. Some physical activity is better than none, and adults who participate in any amount of physical activity gain some health benefits.  Behavioral modification strategies: increasing lean protein intake, meal planning and cooking strategies, keeping healthy foods in the home, and planning for success.  Kathryn [name] has agreed to follow-up with our clinic in 2 to 3 weeks. She was informed of the importance of frequent follow-up visits to maximize her success with intensive lifestyle modifications for her multiple health conditions.   Objective:   Blood pressure 133/72, pulse 74, temperature 98.5 F (36.9 C), height 5\' 4"  (1.626 m), weight 175 lb (79.4 kg), SpO2 97%. Body mass index is 30.04 kg/m.  General: Cooperative, alert, well developed, in no acute distress. HEENT: Conjunctivae and lids unremarkable. Cardiovascular: Regular rhythm.  Lungs: Normal work of breathing. Neurologic: No focal deficits.   Lab Results  Component Value Date   CREATININE 1.02 (H) 07/16/2023   BUN 12 07/16/2023   NA 138 07/16/2023   K 5.1 07/16/2023   CL 102 07/16/2023   CO2 23 07/16/2023   Lab Results  Component Value Date   ALT 19 07/16/2023   AST 23 07/16/2023   ALKPHOS 66 07/16/2023   BILITOT 0.4 07/16/2023   Lab Results  Component Value Date   HGBA1C 5.5 07/16/2023   Lab Results  Component Value Date   INSULIN 9.1 07/16/2023   Lab Results  Component Value Date   TSH 1.330 07/16/2023   Lab Results  Component Value Date   CHOL 191 07/16/2023   HDL 79 07/16/2023   LDLCALC 98 07/16/2023   TRIG 75 07/16/2023   Lab Results  Component Value Date   VD25OH 61.2 07/16/2023   Lab Results  Component Value Date   WBC 6.4 07/16/2023   HGB 14.0 07/16/2023   HCT 41.8 07/16/2023   MCV 84 07/16/2023   PLT 269 07/16/2023    No results found for: "IRON", "TIBC", "FERRITIN"  Attestation Statements:   Reviewed by clinician on day of visit: allergies, medications, problem list, medical history, surgical history, family history, social history, and previous encounter notes.  Time spent on visit including pre-visit chart review and post-visit care and charting was 45 minutes.   I, Burt Knack, am acting as transcriptionist for Reuben Likes, MD.  I have reviewed the above documentation for accuracy and completeness, and I agree with the above. - Reuben Likes, MD

## 2023-08-06 DIAGNOSIS — F329 Major depressive disorder, single episode, unspecified: Secondary | ICD-10-CM | POA: Diagnosis not present

## 2023-08-07 DIAGNOSIS — F329 Major depressive disorder, single episode, unspecified: Secondary | ICD-10-CM | POA: Diagnosis not present

## 2023-09-03 ENCOUNTER — Ambulatory Visit (INDEPENDENT_AMBULATORY_CARE_PROVIDER_SITE_OTHER): Payer: BC Managed Care – PPO | Admitting: Family Medicine

## 2023-09-08 DIAGNOSIS — J029 Acute pharyngitis, unspecified: Secondary | ICD-10-CM | POA: Diagnosis not present

## 2023-09-08 DIAGNOSIS — R059 Cough, unspecified: Secondary | ICD-10-CM | POA: Diagnosis not present

## 2023-09-08 DIAGNOSIS — J019 Acute sinusitis, unspecified: Secondary | ICD-10-CM | POA: Diagnosis not present

## 2023-09-08 DIAGNOSIS — Z20822 Contact with and (suspected) exposure to covid-19: Secondary | ICD-10-CM | POA: Diagnosis not present

## 2023-10-06 ENCOUNTER — Encounter (INDEPENDENT_AMBULATORY_CARE_PROVIDER_SITE_OTHER): Payer: Self-pay | Admitting: Family Medicine

## 2023-10-06 ENCOUNTER — Ambulatory Visit (INDEPENDENT_AMBULATORY_CARE_PROVIDER_SITE_OTHER): Payer: BC Managed Care – PPO | Admitting: Family Medicine

## 2023-10-06 VITALS — BP 132/80 | HR 70 | Temp 98.6°F | Ht 64.0 in | Wt 176.0 lb

## 2023-10-06 DIAGNOSIS — R7989 Other specified abnormal findings of blood chemistry: Secondary | ICD-10-CM

## 2023-10-06 DIAGNOSIS — J3089 Other allergic rhinitis: Secondary | ICD-10-CM | POA: Diagnosis not present

## 2023-10-06 DIAGNOSIS — E669 Obesity, unspecified: Secondary | ICD-10-CM

## 2023-10-06 DIAGNOSIS — F3289 Other specified depressive episodes: Secondary | ICD-10-CM | POA: Diagnosis not present

## 2023-10-06 DIAGNOSIS — E66811 Obesity, class 1: Secondary | ICD-10-CM

## 2023-10-06 DIAGNOSIS — Z6830 Body mass index (BMI) 30.0-30.9, adult: Secondary | ICD-10-CM

## 2023-10-06 NOTE — Progress Notes (Signed)
Kathryn [name], D.O.  ABFM, ABOM Specializing in Clinical Bariatric Medicine  Office located at: [address] [place], [place]  [address]     Assessment and Plan:   Medications Discontinued During This Encounter  Medication Reason   Nutritional Supplements (NUTRITIONAL SUPPLEMENT PO)    Other depression- with ADD Assessment: Condition is Controlled.. Denies any SI/HI. Mood is stable. Pt informed me that she was placed on Wellbutrin for her diagnosed ADD. She has been on this for years now and is unsure if this is still working as good or not as she takes this consistently.  Cravings and hunger are not well controlled. She endorses having increased hunger around 4 o clock in the afternoon.   Plan: - Continue Wellbutrin 150mg  once daily.   - Importance of following up with PCP and others was stressed   - Reminded patient of the importance of following their prudent nutrition plan and how food can affect mood as well to support emotional wellbeing.    Environmental and seasonal allergies Assessment: Condition is Controlled.. Pt allergies are well controlled. Pt was instructed to use flonase spray, nasal solution rinses 2-3, and to only take Zyrtech when needed as a last resort by her ENT specialist.   Plan: - Continue to use flonase and nasal flushes daily.   - I recommended she use her flonase once in the morning and once before bedtime.   - Continue follow-up with PCP and ENT.    Elevated serum creatinine Assessment: Condition is Not optimized.. Pt had a elevated serum creatinine of 1.02 with a BUN of 12 on 07/16/2023. Pt reports historically taking Advil and Aleve and was instructed to discontinue this as this causes harm to kidney function tests.  Lab Results  Component Value Date   CREATININE 1.02 (H) 07/16/2023   BUN 12 07/16/2023   NA 138 07/16/2023   K 5.1 07/16/2023   CL 102 07/16/2023   CO2 23 07/16/2023      Component Value Date/Time   PROT 6.9  07/16/2023 0937   ALBUMIN 4.3 07/16/2023 0937   AST 23 07/16/2023 0937   ALT 19 07/16/2023 0937   ALKPHOS 66 07/16/2023 0937   BILITOT 0.4 07/16/2023 4403    Plan: - Unless pre-existing renal or cardiopulmonary conditions exist which patient was told to limit their fluid intake by another provider, I recommended roughly one half of their weight in pounds, to be the approximate ounces of non-caloric, non-caffeinated beverages they should drink per day; including more if they are engaging in exercise. Increasing water intake and physical activity will help alleviate constipation symptoms.   - Pt is to avoid nephrotoxic substance and decrease Advil and aleve, alike substance and use tylenol instead.  - Increase exercise to increase blood flow to her kidneys.    TREATMENT PLAN FOR OBESITY: Obesity with starting BMI of 31.4 BMI 30.0-30.9,adult Assessment:  Kathryn [name] is here to discuss her progress with her obesity treatment plan along with follow-up of her obesity related diagnoses. See Medical Weight Management Flowsheet for complete bioelectrical impedance results.  Condition is improving. Biometric data collected today, was reviewed with patient.   Since last office visit on 07/31/2023 patient's  Muscle mass has increased by 1.4lb. Fat mass is the same. Total body water is the same. Counseling done on how various foods will affect these numbers and how to maximize success  Total lbs lost to date: 6 Total weight loss percentage to date: 3.30%   Plan: -  Kathryn [name] will work on healthier eating habits and try to follow the Category 2 meal plan best they can.   Behavioral Intervention Additional resources provided today:  handout on healthy eating during the Holidays Evidence-based interventions for health behavior change were utilized today including the discussion of self monitoring techniques, problem-solving barriers and SMART goal setting techniques.   Regarding patient's less  desirable eating habits and patterns, we employed the technique of small changes.  Pt will specifically work on: learn plan and eat all foods on plan for next visit.     She has agreed to Continue current level of physical activity  and Think about enjoyable ways to increase daily physical activity and overcoming barriers to exercise   FOLLOW UP: Return in about 6 weeks (around 11/17/2023).  She was informed of the importance of frequent follow up visits to maximize her success with intensive lifestyle modifications for her multiple health conditions.  Subjective:   Chief complaint: Obesity Kathryn [name] is here to discuss her progress with her obesity treatment plan. She is on the the Category 2 Plan and states she is following her eating plan approximately 67% of the time. She states she is doing exercise videos 20-40 minutes 4 days per week.  Interval History:  Kathryn [name] is here for a follow up office visit.     Since last office visit: Pt is typically seen by Dr. Marquis Lunch. Pt last OV was on 07/31/2023 due to financial and insurance coverage issues. Since last OV she has been well. She states that the last couple of months she has not followed the prescribed meal plan as closely as she wanted to since her kids have started school. She does not that she is a stress eater at times. Pt go to snack/meal is to make a healthy Sandwich. She also enjoys laughing cow wedge cheese. She notices feeling sluggish and guilty when not eating on plan.   We reviewed her meal plan and all questions were answered.   Review of Systems:  Pertinent positives were addressed with patient today.  Reviewed by clinician on day of visit: allergies, medications, problem list, medical history, surgical history, family history, social history, and previous encounter notes.  Weight Summary and Biometrics   Weight Lost Since Last Visit: 0lb  Weight Gained Since Last Visit: 1lb   Vitals Temp: 98.6 F (37 C) BP:  132/80 Pulse Rate: 70 SpO2: 100 %   Anthropometric Measurements Height: 5\' 4"  (1.626 m) Weight: 176 lb (79.8 kg) BMI (Calculated): 30.2 Weight at Last Visit: 175lb Weight Lost Since Last Visit: 0lb Weight Gained Since Last Visit: 1lb Starting Weight: 182lb Total Weight Loss (lbs): 6 lb (2.722 kg) Peak Weight: 214lb   Body Composition  Body Fat %: 33.8 % Fat Mass (lbs): 59.6 lbs Muscle Mass (lbs): 111 lbs Total Body Water (lbs): 77.6 lbs Visceral Fat Rating : 8   Other Clinical Data Fasting: no Labs: no Today's Visit #: 3 Starting Date: 07/16/23     Objective:   PHYSICAL EXAM: Blood pressure 132/80, pulse 70, temperature 98.6 F (37 C), height 5\' 4"  (1.626 m), weight 176 lb (79.8 kg), SpO2 100%. Body mass index is 30.21 kg/m.  General: Well Developed, well nourished, and in no acute distress.  HEENT: Normocephalic, atraumatic Skin: Warm and dry, cap RF less 2 sec, good turgor Chest:  Normal excursion, shape, no gross abn Respiratory: speaking in full sentences, no conversational dyspnea NeuroM-Sk: Ambulates w/o assistance, moves * 4 Psych: A and O *  3, insight good, mood-full  DIAGNOSTIC DATA REVIEWED:  BMET    Component Value Date/Time   NA 138 07/16/2023 0937   K 5.1 07/16/2023 0937   CL 102 07/16/2023 0937   CO2 23 07/16/2023 0937   GLUCOSE 87 07/16/2023 0937   BUN 12 07/16/2023 0937   CREATININE 1.02 (H) 07/16/2023 0937   CALCIUM 9.7 07/16/2023 0937   Lab Results  Component Value Date   HGBA1C 5.5 07/16/2023   Lab Results  Component Value Date   INSULIN 9.1 07/16/2023   Lab Results  Component Value Date   TSH 1.330 07/16/2023   CBC    Component Value Date/Time   WBC 6.4 07/16/2023 0937   RBC 4.98 07/16/2023 0937   HGB 14.0 07/16/2023 0937   HCT 41.8 07/16/2023 0937   PLT 269 07/16/2023 0937   MCV 84 07/16/2023 0937   MCH 28.1 07/16/2023 0937   MCHC 33.5 07/16/2023 0937   RDW 12.7 07/16/2023 0937   Iron Studies No results  found for: "IRON", "TIBC", "FERRITIN", "IRONPCTSAT" Lipid Panel     Component Value Date/Time   CHOL 191 07/16/2023 0937   TRIG 75 07/16/2023 0937   HDL 79 07/16/2023 0937   LDLCALC 98 07/16/2023 0937   Hepatic Function Panel     Component Value Date/Time   PROT 6.9 07/16/2023 0937   ALBUMIN 4.3 07/16/2023 0937   AST 23 07/16/2023 0937   ALT 19 07/16/2023 0937   ALKPHOS 66 07/16/2023 0937   BILITOT 0.4 07/16/2023 0937      Component Value Date/Time   TSH 1.330 07/16/2023 1610   Nutritional Lab Results  Component Value Date   VD25OH 61.2 07/16/2023    Attestations:   I, Clinical biochemist, acting as a Stage manager for [employer], DO., have compiled all relevant documentation for today's office visit on behalf of Thomasene Lot, DO, while in the presence of [employer], DO.  I have reviewed the above documentation for accuracy and completeness, and I agree with the above. Kathryn [name], D.O.  The 21st Century Cures Act was signed into law in 2016 which includes the topic of electronic health records.  This provides immediate access to information in MyChart.  This includes consultation notes, operative notes, office notes, lab results and pathology reports.  If you have any questions about what you read please let us know at your next visit so we can discuss your concerns and take corrective action if need be.  We are right here with you.

## 2023-10-28 DIAGNOSIS — Z309 Encounter for contraceptive management, unspecified: Secondary | ICD-10-CM | POA: Diagnosis not present

## 2023-11-19 ENCOUNTER — Ambulatory Visit (INDEPENDENT_AMBULATORY_CARE_PROVIDER_SITE_OTHER): Payer: BC Managed Care – PPO | Admitting: Family Medicine

## 2023-12-29 ENCOUNTER — Ambulatory Visit (INDEPENDENT_AMBULATORY_CARE_PROVIDER_SITE_OTHER): Payer: BC Managed Care – PPO | Admitting: Family Medicine

## 2024-03-29 DIAGNOSIS — M9903 Segmental and somatic dysfunction of lumbar region: Secondary | ICD-10-CM | POA: Diagnosis not present

## 2024-03-29 DIAGNOSIS — M9902 Segmental and somatic dysfunction of thoracic region: Secondary | ICD-10-CM | POA: Diagnosis not present

## 2024-03-29 DIAGNOSIS — M9905 Segmental and somatic dysfunction of pelvic region: Secondary | ICD-10-CM | POA: Diagnosis not present

## 2024-03-29 DIAGNOSIS — M9904 Segmental and somatic dysfunction of sacral region: Secondary | ICD-10-CM | POA: Diagnosis not present

## 2024-03-30 DIAGNOSIS — M9904 Segmental and somatic dysfunction of sacral region: Secondary | ICD-10-CM | POA: Diagnosis not present

## 2024-03-30 DIAGNOSIS — M9905 Segmental and somatic dysfunction of pelvic region: Secondary | ICD-10-CM | POA: Diagnosis not present

## 2024-03-30 DIAGNOSIS — M9903 Segmental and somatic dysfunction of lumbar region: Secondary | ICD-10-CM | POA: Diagnosis not present

## 2024-03-30 DIAGNOSIS — M9902 Segmental and somatic dysfunction of thoracic region: Secondary | ICD-10-CM | POA: Diagnosis not present

## 2024-03-31 DIAGNOSIS — M9904 Segmental and somatic dysfunction of sacral region: Secondary | ICD-10-CM | POA: Diagnosis not present

## 2024-03-31 DIAGNOSIS — M9902 Segmental and somatic dysfunction of thoracic region: Secondary | ICD-10-CM | POA: Diagnosis not present

## 2024-03-31 DIAGNOSIS — M9903 Segmental and somatic dysfunction of lumbar region: Secondary | ICD-10-CM | POA: Diagnosis not present

## 2024-03-31 DIAGNOSIS — M9905 Segmental and somatic dysfunction of pelvic region: Secondary | ICD-10-CM | POA: Diagnosis not present

## 2024-04-07 DIAGNOSIS — M9903 Segmental and somatic dysfunction of lumbar region: Secondary | ICD-10-CM | POA: Diagnosis not present

## 2024-04-07 DIAGNOSIS — M9905 Segmental and somatic dysfunction of pelvic region: Secondary | ICD-10-CM | POA: Diagnosis not present

## 2024-04-07 DIAGNOSIS — M9902 Segmental and somatic dysfunction of thoracic region: Secondary | ICD-10-CM | POA: Diagnosis not present

## 2024-04-07 DIAGNOSIS — M9904 Segmental and somatic dysfunction of sacral region: Secondary | ICD-10-CM | POA: Diagnosis not present

## 2024-05-13 ENCOUNTER — Other Ambulatory Visit: Payer: Self-pay | Admitting: *Deleted

## 2024-05-13 DIAGNOSIS — Z1231 Encounter for screening mammogram for malignant neoplasm of breast: Secondary | ICD-10-CM

## 2024-05-18 ENCOUNTER — Ambulatory Visit
Admission: RE | Admit: 2024-05-18 | Discharge: 2024-05-18 | Disposition: A | Discharge status: Home / Self Care | Source: Ambulatory Visit | Attending: *Deleted | Admitting: *Deleted

## 2024-05-18 DIAGNOSIS — Z1231 Encounter for screening mammogram for malignant neoplasm of breast: Secondary | ICD-10-CM | POA: Diagnosis not present

## 2024-05-28 DIAGNOSIS — D225 Melanocytic nevi of trunk: Secondary | ICD-10-CM | POA: Diagnosis not present

## 2024-05-28 DIAGNOSIS — D2272 Melanocytic nevi of left lower limb, including hip: Secondary | ICD-10-CM | POA: Diagnosis not present

## 2024-05-28 DIAGNOSIS — D485 Neoplasm of uncertain behavior of skin: Secondary | ICD-10-CM | POA: Diagnosis not present

## 2024-05-28 DIAGNOSIS — L821 Other seborrheic keratosis: Secondary | ICD-10-CM | POA: Diagnosis not present

## 2024-05-28 DIAGNOSIS — L720 Epidermal cyst: Secondary | ICD-10-CM | POA: Diagnosis not present

## 2024-05-28 DIAGNOSIS — L570 Actinic keratosis: Secondary | ICD-10-CM | POA: Diagnosis not present

## 2024-05-28 DIAGNOSIS — L814 Other melanin hyperpigmentation: Secondary | ICD-10-CM | POA: Diagnosis not present

## 2024-06-03 DIAGNOSIS — N920 Excessive and frequent menstruation with regular cycle: Secondary | ICD-10-CM | POA: Diagnosis not present

## 2024-06-03 DIAGNOSIS — R519 Headache, unspecified: Secondary | ICD-10-CM | POA: Diagnosis not present

## 2024-06-16 ENCOUNTER — Other Ambulatory Visit (INDEPENDENT_AMBULATORY_CARE_PROVIDER_SITE_OTHER): Payer: Self-pay

## 2024-06-16 ENCOUNTER — Encounter: Payer: Self-pay | Admitting: Physician Assistant

## 2024-06-16 ENCOUNTER — Ambulatory Visit (INDEPENDENT_AMBULATORY_CARE_PROVIDER_SITE_OTHER): Admitting: Physician Assistant

## 2024-06-16 DIAGNOSIS — N840 Polyp of corpus uteri: Secondary | ICD-10-CM | POA: Diagnosis not present

## 2024-06-16 DIAGNOSIS — M79672 Pain in left foot: Secondary | ICD-10-CM | POA: Diagnosis not present

## 2024-06-16 DIAGNOSIS — G8929 Other chronic pain: Secondary | ICD-10-CM

## 2024-06-16 DIAGNOSIS — M222X2 Patellofemoral disorders, left knee: Secondary | ICD-10-CM

## 2024-06-16 DIAGNOSIS — N920 Excessive and frequent menstruation with regular cycle: Secondary | ICD-10-CM | POA: Diagnosis not present

## 2024-06-16 DIAGNOSIS — D251 Intramural leiomyoma of uterus: Secondary | ICD-10-CM | POA: Diagnosis not present

## 2024-06-16 DIAGNOSIS — M25562 Pain in left knee: Secondary | ICD-10-CM | POA: Diagnosis not present

## 2024-06-16 NOTE — Progress Notes (Signed)
 Office Visit Note   Patient: Kathryn [name]           Date of Birth: 1966/08/31           MRN: [id number] Visit Date: 06/16/2024              Requested by: Cristopher Suzen CHRISTELLA, NP [address] [place],  [place] [address] PCP: Cristopher Suzen CHRISTELLA, NP   Assessment & Plan: Visit Diagnoses:  1. Pain in left foot   2. Chronic pain of left knee     Plan: The patient would like to stay as conservative as possible at this point in time for the knee and the foot.  Therefore she will work on quad strengthening of the knee exercise reviewed.  Voltaren gel 4 g 4 times daily to the foot knee as needed.  She will obtain a lateral wedge orthotic for her left shoe.  She may benefit from injections of either her left foot CC joint and/or left knee injection.  Also could benefit from formal therapy and shockwave therapy especially to the left foot peroneal tendons.  Follow-Up Instructions: Return if symptoms worsen or fail to improve.   Orders:  Orders Placed This Encounter  Procedures   XR Foot Complete Left   XR Knee 1-2 Views Left   No orders of the defined types were placed in this encounter.     Procedures: No procedures performed   Clinical Data: No additional findings.   Subjective: Chief Complaint  Patient presents with   Left Knee - Pain   Left Foot - Pain  Thank you  HPI Kathryn [name] is well-known to Dr. Vernetta service comes in today after being absent from the prednisone 5 years.  She is having left knee pain again.  No new injury.  States is just aggravating worse with prolonged sitting and going downstairs.  No new injury.  She has had no real treatment for this.  Pain is also worse with weather changes.  Does occasionally feel like it will buckle.  New complaint of the left foot pain has been ongoing since December no injury.  Pain is over the dorsal aspect of the foot and the lateral ankle.  She notes she walks on the lateral aspect of her foot no swelling.  No  treatment outside of Voltaren gel.  Ranks his pain to be 7.5 out of 10 pain at worst.  Review of Systems  Constitutional:  Negative for chills and fever.  Musculoskeletal:  Positive for arthralgias.     Objective: Vital Signs: LMP 04/27/2024   Physical Exam Constitutional:      Appearance: She is not ill-appearing or diaphoretic.  Pulmonary:     Effort: Pulmonary effort is normal.  Neurological:     Mental Status: She is alert and oriented to person, place, and time.  Psychiatric:        Mood and Affect: Mood normal.     Ortho Exam Bilateral knees: Good range of motion both knees.  Left knee with patellofemoral crepitus.  Slight tenderness along medial lateral joint line.  No instability valgus varus stressing.  Both knees without effusion abnormal warmth or erythema.  Calfs are supple nontender. Left foot: Dorsal pedal pulses 2+.  No rashes skin lesions ulcerations.  Peroneal tendons bilaterally tender over the left foot peroneal tendons just distal to the lateral malleolus.  Maximal tenderness at the CC joint.  Otherwise bilateral feet nontender throughout.  Strength and range of motion of the  first metatarsal causes lateral left ankle pain.  Supinates with ambulation left foot. Specialty Comments:  No specialty comments available.  Imaging: XR Foot Complete Left Result Date: 06/16/2024 Left foot 3 views: No acute fracture.  Os peroneus present.  Slight Haglund's deformity.  Sesamoids well located.  Morton's type foot with the 2nd, 3rd and 4th metatarsals being longer than the first.  No significant arthritic changes otherwise.  No bony abnormalities otherwise.  XR Knee 1-2 Views Left Result Date: 06/16/2024 Left knee 2 views: Knee is well located.  Medial lateral joint line overall well-preserved.  Heterotopic bone about the lateral aspect of the patella unchanged from prior films.  No acute findings.    PMFS History: There are no active problems to display for this  patient.  Past Medical History:  Diagnosis Date   ADD (attention deficit disorder)    Allergy    Anemia    with pregnancy    Anxiety    Back pain    Bilateral swelling of feet    Bipolar 1 disorder (HCC)    Chest pain    Constipation    Depression    GERD (gastroesophageal reflux disease)    mild- prn tums    Joint pain    Lactose intolerance    SOB (shortness of breath)    Swallowing difficulty     Family History  Problem Relation Age of Onset   Cancer Mother    Cervical cancer Mother    Depression Mother    Drug abuse Mother    Alcoholism Father    Stomach cancer Maternal Grandfather    Colon cancer Neg Hx    Colon polyps Neg Hx    Rectal cancer Neg Hx    BRCA 1/2 Neg Hx    Breast cancer Neg Hx     Past Surgical History:  Procedure Laterality Date   CESAREAN SECTION     CHOLECYSTECTOMY     Social History   Occupational History   Occupation: Programmer, systems  Tobacco Use   Smoking status: Former   Smokeless tobacco: Never  Advertising account planner   Vaping status: Never Used  Substance and Sexual Activity   Alcohol use: Yes    Comment: very rare   Drug use: No   Sexual activity: Not on file

## 2024-06-22 DIAGNOSIS — M9902 Segmental and somatic dysfunction of thoracic region: Secondary | ICD-10-CM | POA: Diagnosis not present

## 2024-06-22 DIAGNOSIS — L821 Other seborrheic keratosis: Secondary | ICD-10-CM | POA: Diagnosis not present

## 2024-06-22 DIAGNOSIS — M62838 Other muscle spasm: Secondary | ICD-10-CM | POA: Diagnosis not present

## 2024-06-22 DIAGNOSIS — L720 Epidermal cyst: Secondary | ICD-10-CM | POA: Diagnosis not present

## 2024-06-22 DIAGNOSIS — M9901 Segmental and somatic dysfunction of cervical region: Secondary | ICD-10-CM | POA: Diagnosis not present

## 2024-06-22 DIAGNOSIS — L309 Dermatitis, unspecified: Secondary | ICD-10-CM | POA: Diagnosis not present

## 2024-06-24 ENCOUNTER — Other Ambulatory Visit: Payer: Self-pay | Admitting: Obstetrics and Gynecology

## 2024-06-29 ENCOUNTER — Encounter (HOSPITAL_COMMUNITY): Payer: Self-pay

## 2024-06-29 ENCOUNTER — Encounter (HOSPITAL_COMMUNITY): Payer: Self-pay | Admitting: Obstetrics and Gynecology

## 2024-06-29 NOTE — Progress Notes (Signed)
 Spoke w/ via phone for pre-op interview--- Kathryn [name] needs dos----  CBC per surgeon. UPT per anesthesia.        [name] results------Current EKG in Epic dated 07/16/23. COVID test -----patient states asymptomatic no test needed Arrive at -------0715 NPO after MN NO Solid Food.   Pre-Surgery Ensure or G2:  Med rec completed Medications to take morning of surgery ----- Wellbutrin Diabetic medication -----  GLP1 agonist last dose: GLP1 instructions:  Patient instructed no nail polish to be worn day of surgery Patient instructed to bring photo id and insurance card day of surgery Patient aware to have Driver (ride ) / caregiver    for 24 hours after surgery - [name] Patient Special Instructions ----- Pre-Op special Instructions -----  Patient verbalized understanding of instructions that were given at this phone interview. Patient denies chest pain, sob, fever, cough at the interview.

## 2024-07-09 ENCOUNTER — Encounter (HOSPITAL_COMMUNITY): Payer: Self-pay | Admitting: Obstetrics and Gynecology

## 2024-07-09 ENCOUNTER — Ambulatory Visit (HOSPITAL_COMMUNITY)
Admission: RE | Admit: 2024-07-09 | Discharge: 2024-07-09 | Disposition: A | Discharge status: Home / Self Care | Attending: Obstetrics and Gynecology | Admitting: Obstetrics and Gynecology

## 2024-07-09 ENCOUNTER — Encounter (HOSPITAL_COMMUNITY)
Admission: RE | Disposition: A | Discharge status: Home / Self Care | Payer: Self-pay | Source: Home / Self Care | Attending: Obstetrics and Gynecology

## 2024-07-09 ENCOUNTER — Ambulatory Visit (HOSPITAL_COMMUNITY): Payer: Self-pay | Admitting: Anesthesiology

## 2024-07-09 ENCOUNTER — Other Ambulatory Visit: Payer: Self-pay

## 2024-07-09 ENCOUNTER — Encounter (HOSPITAL_COMMUNITY): Payer: Self-pay | Admitting: Anesthesiology

## 2024-07-09 DIAGNOSIS — Z01818 Encounter for other preprocedural examination: Secondary | ICD-10-CM

## 2024-07-09 DIAGNOSIS — N920 Excessive and frequent menstruation with regular cycle: Secondary | ICD-10-CM | POA: Insufficient documentation

## 2024-07-09 DIAGNOSIS — D250 Submucous leiomyoma of uterus: Secondary | ICD-10-CM | POA: Insufficient documentation

## 2024-07-09 DIAGNOSIS — Z87891 Personal history of nicotine dependence: Secondary | ICD-10-CM | POA: Insufficient documentation

## 2024-07-09 DIAGNOSIS — N840 Polyp of corpus uteri: Secondary | ICD-10-CM | POA: Insufficient documentation

## 2024-07-09 HISTORY — PX: DILATION AND CURETTAGE OF UTERUS: SHX78

## 2024-07-09 HISTORY — PX: HYSTEROSCOPY WITH MYOMECTOMY: SHX7591

## 2024-07-09 LAB — CBC
HCT: 43.3 % (ref 36.0–46.0)
Hemoglobin: 14.6 g/dL (ref 12.0–15.0)
MCH: 27.9 pg (ref 26.0–34.0)
MCHC: 33.7 g/dL (ref 30.0–36.0)
MCV: 82.6 fL (ref 80.0–100.0)
Platelets: 239 K/uL (ref 150–400)
RBC: 5.24 MIL/uL — ABNORMAL HIGH (ref 3.87–5.11)
RDW: 12.4 % (ref 11.5–15.5)
WBC: 6 K/uL (ref 4.0–10.5)
nRBC: 0 % (ref 0.0–0.2)

## 2024-07-09 LAB — POCT PREGNANCY, URINE: Preg Test, Ur: NEGATIVE

## 2024-07-09 SURGERY — HYSTEROSCOPY WITH MYOMECTOMY
Anesthesia: General

## 2024-07-09 MED ORDER — ONDANSETRON HCL 4 MG/2ML IJ SOLN
INTRAMUSCULAR | Status: AC
Start: 2024-07-09 — End: 2024-07-09
  Filled 2024-07-09: qty 2

## 2024-07-09 MED ORDER — OXYCODONE HCL 5 MG PO TABS
ORAL_TABLET | ORAL | Status: AC
  Filled 2024-07-09: qty 1

## 2024-07-09 MED ORDER — FENTANYL CITRATE (PF) 100 MCG/2ML IJ SOLN: 25.0000 ug | INTRAMUSCULAR | Status: DC | PRN

## 2024-07-09 MED ORDER — LACTATED RINGERS IV SOLN: INTRAVENOUS | Status: DC

## 2024-07-09 MED ORDER — IBUPROFEN 800 MG PO TABS: 800.0000 mg | ORAL_TABLET | Freq: Four times a day (QID) | ORAL | 0 refills | Status: AC | PRN

## 2024-07-09 MED ORDER — ACETAMINOPHEN 500 MG PO TABS
ORAL_TABLET | ORAL | Status: AC
  Filled 2024-07-09: qty 2

## 2024-07-09 MED ORDER — LIDOCAINE 2% (20 MG/ML) 5 ML SYRINGE
INTRAMUSCULAR | Status: DC | PRN
  Administered 2024-07-09: 100 mg via INTRAVENOUS

## 2024-07-09 MED ORDER — LIDOCAINE 2% (20 MG/ML) 5 ML SYRINGE
INTRAMUSCULAR | Status: AC
  Filled 2024-07-09: qty 5

## 2024-07-09 MED ORDER — OXYCODONE HCL 5 MG PO TABS
5.0000 mg | ORAL_TABLET | Freq: Once | ORAL | Status: AC | PRN
  Administered 2024-07-09: 5 mg via ORAL

## 2024-07-09 MED ORDER — PROPOFOL 10 MG/ML IV BOLUS
INTRAVENOUS | Status: DC | PRN
  Administered 2024-07-09: 200 mg via INTRAVENOUS

## 2024-07-09 MED ORDER — FENTANYL CITRATE (PF) 250 MCG/5ML IJ SOLN
INTRAMUSCULAR | Status: AC
Start: 2024-07-09 — End: 2024-07-09
  Filled 2024-07-09: qty 5

## 2024-07-09 MED ORDER — CELECOXIB 200 MG PO CAPS
ORAL_CAPSULE | ORAL | Status: DC
Start: 2024-07-09 — End: 2024-07-09
  Filled 2024-07-09: qty 1

## 2024-07-09 MED ORDER — MEPERIDINE HCL 25 MG/ML IJ SOLN: 6.2500 mg | INTRAMUSCULAR | Status: DC | PRN

## 2024-07-09 MED ORDER — VASOPRESSIN 20 UNIT/ML IV SOLN
INTRAVENOUS | Status: AC
  Filled 2024-07-09: qty 1

## 2024-07-09 MED ORDER — VASOPRESSIN 20 UNIT/ML IV SOLN
INTRAVENOUS | Status: DC | PRN
  Administered 2024-07-09: 10 mL

## 2024-07-09 MED ORDER — ONDANSETRON HCL 4 MG/2ML IJ SOLN: 4.0000 mg | Freq: Once | INTRAMUSCULAR | Status: DC | PRN

## 2024-07-09 MED ORDER — ACETAMINOPHEN 500 MG PO TABS
1000.0000 mg | ORAL_TABLET | Freq: Once | ORAL | Status: AC
  Administered 2024-07-09: 1000 mg via ORAL

## 2024-07-09 MED ORDER — SODIUM CHLORIDE (PF) 0.9 % IJ SOLN
INTRAMUSCULAR | Status: AC
  Filled 2024-07-09: qty 100

## 2024-07-09 MED ORDER — ONDANSETRON HCL 4 MG/2ML IJ SOLN
INTRAMUSCULAR | Status: DC | PRN
  Administered 2024-07-09: 4 mg via INTRAVENOUS

## 2024-07-09 MED ORDER — CHLORHEXIDINE GLUCONATE 0.12 % MT SOLN
15.0000 mL | Freq: Once | OROMUCOSAL | Status: AC
  Administered 2024-07-09: 15 mL via OROMUCOSAL

## 2024-07-09 MED ORDER — DEXAMETHASONE SODIUM PHOSPHATE 10 MG/ML IJ SOLN
INTRAMUSCULAR | Status: DC | PRN
  Administered 2024-07-09: 10 mg via INTRAVENOUS

## 2024-07-09 MED ORDER — SODIUM CHLORIDE 0.9 % IR SOLN
Status: DC | PRN
Start: 2024-07-09 — End: 2024-07-09
  Administered 2024-07-09: 1000 mL

## 2024-07-09 MED ORDER — CHLORHEXIDINE GLUCONATE 0.12 % MT SOLN
OROMUCOSAL | Status: AC
  Filled 2024-07-09: qty 15

## 2024-07-09 MED ORDER — FENTANYL CITRATE (PF) 250 MCG/5ML IJ SOLN
INTRAMUSCULAR | Status: DC | PRN
  Administered 2024-07-09 (×2): 25 ug via INTRAVENOUS
  Administered 2024-07-09: 100 ug via INTRAVENOUS

## 2024-07-09 MED ORDER — MIDAZOLAM HCL 2 MG/2ML IJ SOLN
INTRAMUSCULAR | Status: AC
  Filled 2024-07-09: qty 2

## 2024-07-09 MED ORDER — PROPOFOL 10 MG/ML IV BOLUS
INTRAVENOUS | Status: AC
  Filled 2024-07-09: qty 20

## 2024-07-09 MED ORDER — CELECOXIB 200 MG PO CAPS
200.0000 mg | ORAL_CAPSULE | Freq: Once | ORAL | Status: AC
  Administered 2024-07-09: 200 mg via ORAL

## 2024-07-09 MED ORDER — DEXAMETHASONE SODIUM PHOSPHATE 10 MG/ML IJ SOLN
INTRAMUSCULAR | Status: AC
  Filled 2024-07-09: qty 1

## 2024-07-09 MED ORDER — ORAL CARE MOUTH RINSE: 15.0000 mL | Freq: Once | OROMUCOSAL | Status: AC

## 2024-07-09 MED ORDER — OXYCODONE HCL 5 MG/5ML PO SOLN: 5.0000 mg | Freq: Once | ORAL | Status: AC | PRN

## 2024-07-09 MED ORDER — MIDAZOLAM HCL 2 MG/2ML IJ SOLN
INTRAMUSCULAR | Status: DC | PRN
  Administered 2024-07-09: 2 mg via INTRAVENOUS

## 2024-07-09 SURGICAL SUPPLY — 21 items
CATH ROBINSON RED A/P 16FR (CATHETERS) ×2 IMPLANT
DEVICE MYOSURE LITE (MISCELLANEOUS) IMPLANT
DEVICE MYOSURE REACH (MISCELLANEOUS) IMPLANT
DILATOR CANAL MILEX (MISCELLANEOUS) IMPLANT
GLOVE ECLIPSE 6.5 STRL STRAW (GLOVE) ×2 IMPLANT
GLOVE SURG UNDER POLY LF SZ7 (GLOVE) ×2 IMPLANT
GOWN STRL REUS W/ TWL LRG LVL3 (GOWN DISPOSABLE) ×2 IMPLANT
KIT PROCEDURE FLUENT (KITS) ×2 IMPLANT
KIT TURNOVER KIT B (KITS) ×2 IMPLANT
NDL ASPIRATION 22 (NEEDLE) IMPLANT
NDL FILTER BLUNT 18X1 1/2 (NEEDLE) IMPLANT
NEEDLE ASPIRATION 22 (NEEDLE) ×2 IMPLANT
NEEDLE FILTER BLUNT 18X1 1/2 (NEEDLE) ×2 IMPLANT
PACK VAGINAL MINOR WOMEN LF (CUSTOM PROCEDURE TRAY) ×2 IMPLANT
SEAL CERVICAL OMNI LOK (ABLATOR) IMPLANT
SEAL ROD LENS SCOPE MYOSURE (ABLATOR) ×2 IMPLANT
SOLUTION SCRB POV-IOD 4OZ 7.5% (MISCELLANEOUS) IMPLANT
SYR 3ML LL SCALE MARK (SYRINGE) IMPLANT
SYSTEM TISS REMOVAL MYOSURE XL (MISCELLANEOUS) IMPLANT
TOWEL GREEN STERILE FF (TOWEL DISPOSABLE) ×2 IMPLANT
UNDERPAD 30X36 HEAVY ABSORB (UNDERPADS AND DIAPERS) ×2 IMPLANT

## 2024-07-09 NOTE — Anesthesia Preprocedure Evaluation (Addendum)
 Anesthesia Evaluation  Patient identified by MRN, date of birth, ID band Patient awake    Reviewed: Allergy & Precautions, H&P , NPO status , Patient's Chart, lab work & pertinent test results  Airway Mallampati: II  TM Distance: >3 FB Neck ROM: Full    Dental no notable dental hx. (+) Dental Advisory Given   Pulmonary neg pulmonary ROS, former smoker   Pulmonary exam normal breath sounds clear to auscultation       Cardiovascular Exercise Tolerance: Good negative cardio ROS Normal cardiovascular exam Rhythm:Regular Rate:Normal     Neuro/Psych  PSYCHIATRIC DISORDERS Anxiety Depression Bipolar Disorder   negative neurological ROS  negative psych ROS   GI/Hepatic negative GI ROS, Neg liver ROS,GERD  ,,  Endo/Other  negative endocrine ROS    Renal/GU negative Renal ROS  negative genitourinary   Musculoskeletal negative musculoskeletal ROS (+)    Abdominal   Peds negative pediatric ROS (+)  Hematology negative hematology ROS (+)   Anesthesia Other Findings   Reproductive/Obstetrics negative OB ROS                              Anesthesia Physical Anesthesia Plan  ASA: 2  Anesthesia Plan: General   Post-op Pain Management: Tylenol  PO (pre-op)* and Celebrex  PO (pre-op)*   Induction: Intravenous  PONV Risk Score and Plan: 3 and Ondansetron , Dexamethasone  and Treatment may vary due to age or medical condition  Airway Management Planned: Oral ETT and LMA  Additional Equipment: None  Intra-op Plan:   Post-operative Plan: Extubation in OR  Informed Consent: I have reviewed the patients History and Physical, chart, labs and discussed the procedure including the risks, benefits and alternatives for the proposed anesthesia with the patient or authorized representative who has indicated his/her understanding and acceptance.       Plan Discussed with: Anesthesiologist and  CRNA  Anesthesia Plan Comments: (  )         Anesthesia Quick Evaluation

## 2024-07-09 NOTE — Discharge Instructions (Addendum)
 DO NOT TAKE TYLENOL  UNTIL AFTER 2PM TODAY. DO NOT TAKE MOTRIN  UNTIL AFTER 2PM TODAY.    Post Anesthesia Home Care Instructions  Activity: Get plenty of rest for the remainder of the day. A responsible adult should stay with you for 24 hours following the procedure.  For the next 24 hours, DO NOT: -Drive a car -Advertising copywriter -Drink alcoholic beverages -Take any medication unless instructed by your physician -Make any legal decisions or sign important papers.  Meals: Start with liquid foods such as gelatin or soup. Progress to regular foods as tolerated. Avoid greasy, spicy, heavy foods. If nausea and/or vomiting occur, drink only clear liquids until the nausea and/or vomiting subsides. Call your physician if vomiting continues.  Special Instructions/Symptoms: Your throat may feel dry or sore from the anesthesia or the breathing tube placed in your throat during surgery. If this causes discomfort, gargle with warm salt water. The discomfort should disappear within 24 hours.  If you had a scopolamine patch placed behind your ear for the management of post- operative nausea and/or vomiting:  1. The medication in the patch is effective for 72 hours, after which it should be removed.  Wrap patch in a tissue and discard in the trash. Wash hands thoroughly with soap and water. 2. You may remove the patch earlier than 72 hours if you experience unpleasant side effects which may include dry mouth, dizziness or visual disturbances. 3. Avoid touching the patch. Wash your hands with soap and water after contact with the patch.  Call your surgeon if you experience:   1.  Fever over 101.0. 2.  Inability to urinate. 3.  Nausea and/or vomiting. 4.  Extreme swelling or bruising at the surgical site. 5.  Continued bleeding from the incision. 6.  Increased pain, redness or drainage from the incision. 7.  Problems related to your pain medication. 8. Any change in color, movement and/or  sensation 9. Any problems and/or concerns   CALL  IF TEMP>100.4, NOTHING PER VAGINA X  1WK, CALL IF SOAKING A MAXI  PAD EVERY HOUR OR MORE FREQUENTLY

## 2024-07-09 NOTE — Anesthesia Postprocedure Evaluation (Signed)
 Anesthesia Post Note  Patient: Kathryn [name]  Procedure(s) Performed: HYSTEROSCOPY WITH RESECTION OF ENDOMETRIAL POLYP AND SUBMUCOSAL FIBROID HYSTEROSCOPY, DIAGNOSTIC DILATION AND CURETTAGE     Patient location during evaluation: PACU Anesthesia Type: General Level of consciousness: awake and alert Pain management: pain level controlled Vital Signs Assessment: post-procedure vital signs reviewed and stable Respiratory status: spontaneous breathing, nonlabored ventilation, respiratory function stable and patient connected to nasal cannula oxygen Cardiovascular status: blood pressure returned to baseline and stable Postop Assessment: no apparent nausea or vomiting Anesthetic complications: no   No notable events documented.  Last Vitals:  Vitals:   07/09/24 0757 07/09/24 1027  BP: 132/82 129/79  Pulse: 67 64  Resp: 16 11  Temp: 36.6 C 36.5 C  SpO2: 98% 100%    Last Pain:  Vitals:   07/09/24 1027  TempSrc:   PainSc: Asleep                 Dejour Vos

## 2024-07-09 NOTE — Anesthesia Procedure Notes (Signed)
 Procedure Name: LMA Insertion Date/Time: 07/09/2024 9:24 AM  Performed by: Mallory Manus, MDPre-anesthesia Checklist: Patient identified, Emergency Drugs available, Suction available and Patient being monitored Patient Re-evaluated:Patient Re-evaluated prior to induction Oxygen Delivery Method: Circle System Utilized Preoxygenation: Pre-oxygenation with 100% oxygen Induction Type: IV induction Ventilation: Mask ventilation without difficulty LMA: LMA inserted LMA Size: 4.0 Number of attempts: 1 Airway Equipment and Method: Bite block Placement Confirmation: positive ETCO2 Tube secured with: Tape Dental Injury: Teeth and Oropharynx as per pre-operative assessment

## 2024-07-09 NOTE — Transfer of Care (Signed)
 Immediate Anesthesia Transfer of Care Note  Patient: Kathryn [name]  Procedure(s) Performed: HYSTEROSCOPY WITH RESECTION OF ENDOMETRIAL POLYP AND SUBMUCOSAL FIBROID HYSTEROSCOPY, DIAGNOSTIC DILATION AND CURETTAGE  Patient Location: PACU  Anesthesia Type:General  Level of Consciousness: awake, alert , oriented, and patient cooperative  Airway & Oxygen Therapy: Patient Spontanous Breathing and Patient connected to nasal cannula oxygen  Post-op Assessment: Report given to RN, Post -op Vital signs reviewed and stable, and Patient moving all extremities X 4  Post vital signs: Reviewed and stable  Last Vitals:  Vitals Value Taken Time  BP 124/100 07/09/24 10:30  Temp    Pulse 64 07/09/24 10:33  Resp 12 07/09/24 10:33  SpO2 98 % 07/09/24 10:33  Vitals shown include unfiled device data.  Last Pain:  Vitals:   07/09/24 0757  TempSrc: Oral  PainSc: 0-No pain      Patients Stated Pain Goal: 5 (07/09/24 0757)  Complications: No notable events documented.

## 2024-07-09 NOTE — Interval H&P Note (Signed)
 History and Physical Interval Note:  07/09/2024 9:04 AM  Kathryn [name]  has presented today for surgery, with the diagnosis of menorraghia, submucosal fibroid, endometrial polyp.  The various methods of treatment have been discussed with the patient and family. After consideration of risks, benefits and other options for treatment, the patient has consented to Diagnostic hysteroscopy, hysteroscopic resection of submucosal fibroid and endometrial polyp using myosure as a surgical intervention.  The patient's history has been reviewed, patient examined, no change in status, stable for surgery.  I have reviewed the patient's chart and labs.  Questions were answered to the patient's satisfaction.     [name] A Adetokunbo Mccadden

## 2024-07-09 NOTE — Brief Op Note (Signed)
 07/09/2024  10:39 AM  PATIENT:  Kathryn [name]  58 y.o. female  PRE-OPERATIVE DIAGNOSIS:  menorraghia, submucosal fibroid, endometrial polyp  POST-OPERATIVE DIAGNOSIS:  menorraghia, submucosal fibroid, endometrial polyp  PROCEDURE:  diagnostic hysteroscopy, hysteroscopic resection of endometrial polyp and sumucosal fibroid resection, dilation and curettage  SURGEON:  Surgeons and Role:    * Rutherford Gain, MD - Primary  PHYSICIAN ASSISTANT:   ASSISTANTS: none   ANESTHESIA:   general Findings: multiple SM fibroids , largest posterior. Endometrial polyp. Tubal ostias seen EBL:  3 mL   BLOOD ADMINISTERED:none  DRAINS: none   LOCAL MEDICATIONS USED:  NONE  SPECIMEN:  Source of Specimen:  emc with polyp and fibroid resection  DISPOSITION OF SPECIMEN:  PATHOLOGY  COUNTS:  YES  TOURNIQUET:  * No tourniquets in log *  DICTATION: .Other Dictation: Dictation Number [id number]  PLAN OF CARE: Discharge to home after PACU  PATIENT DISPOSITION:  PACU - hemodynamically stable.   Delay start of Pharmacological VTE agent (>24hrs) due to surgical blood loss or risk of bleeding: no

## 2024-07-09 NOTE — H&P (Signed)
 Kathryn [name] is an 58 y.o. female. [id number] MWF presents for dx hysteroscopy, D&C, hysteroscopic resection of endometrial polyp and submucosal fibroid due to menorrhagia, and  on sonogram findings of multiple SM fibroid, endometrial polyp  Pertinent Gynecological History: Menses: menorrhagia Bleeding: menorrhagia Contraception: OCP (estrogen/progesterone) DES exposure: denies Blood transfusions: none Sexually transmitted diseases: no past history Previous GYN Procedures:   Last mammogram: normal Date: 2025 Last pap: normal Date: 2024 OB History: G5, P3   Menstrual History: Menarche age: n/Kathryn Patient's last menstrual period was 06/22/2024 (exact date).    Past Medical History:  Diagnosis Date   ADD (attention deficit disorder)    Allergy    Anemia    with pregnancy    Anxiety    Back pain    Bilateral swelling of feet    Bipolar 1 disorder (HCC)    Chest pain    Constipation    Depression    GERD (gastroesophageal reflux disease)    mild- prn tums    Joint pain    Lactose intolerance    SOB (shortness of breath)    Swallowing difficulty     Past Surgical History:  Procedure Laterality Date   CESAREAN SECTION     CHOLECYSTECTOMY      Family History  Problem Relation Age of Onset   Cancer Mother    Cervical cancer Mother    Depression Mother    Drug abuse Mother    Alcoholism Father    Stomach cancer Maternal Grandfather    Colon cancer Neg Hx    Colon polyps Neg Hx    Rectal cancer Neg Hx    BRCA 1/2 Neg Hx    Breast cancer Neg Hx     Social History:  reports that she has quit smoking. She has never used smokeless tobacco. She reports current alcohol use. She reports that she does not use drugs.  Allergies:  Allergies  Allergen Reactions   Codeine Itching    No medications prior to admission.    Review of Systems  All other systems reviewed and are negative.   Height 5' 4 (1.626 m), weight 86.2 kg, last menstrual period  06/22/2024. Physical Exam Constitutional:      Appearance: Normal appearance.  HENT:     Head: Atraumatic.  Eyes:     Extraocular Movements: Extraocular movements intact.  Cardiovascular:     Rate and Rhythm: Regular rhythm.     Heart sounds: Normal heart sounds.  Pulmonary:     Breath sounds: Normal breath sounds.  Abdominal:     Palpations: Abdomen is soft.  Genitourinary:    General: Normal vulva.     Comments: Vagina; nl lesion Cervix parous Uterus AV Adnexa: nl lesion Musculoskeletal:     Cervical back: Neck supple.  Skin:    General: Skin is warm and dry.  Neurological:     General: No focal deficit present.     Mental Status: She is alert and oriented to person, place, and time.  Psychiatric:        Mood and Affect: Mood normal.        Behavior: Behavior normal.     No results found for this or any previous visit (from the past 24 hours).  No results found.  Assessment/Plan: Menorrhagia Endometrial polyp Submucous fibroid P) dx hysteroscopy, D&C, hysteroscopic resection of SM fibroid and endometrial polyp using Myosure. Procedure  explained. Risk of surgery reviewed including infection, bleeding, injury to surrounding organ structures, thermal injury, fluid  overload and its mgmt, possible need for blood transfusion and its risk. All ? answered  Kathryn [name] Kathryn [name] 07/09/2024, 3:49 AM

## 2024-07-10 ENCOUNTER — Encounter (HOSPITAL_COMMUNITY): Payer: Self-pay | Admitting: Obstetrics and Gynecology

## 2024-07-10 NOTE — Op Note (Unsigned)
 NAME: [name], Kathryn M. MEDICAL RECORD NO: [id number] ACCOUNT NO: [id number] DATE OF BIRTH: 1966/07/06 FACILITY: [facility] LOCATION: [facility] PHYSICIAN: Chenee Munns A. Rutherford, MD  Operative Report   DATE OF PROCEDURE: 07/09/2024  PREOPERATIVE DIAGNOSES:  Menorrhagia.  Submucosal fibroids.  Endometrial polyp.  PROCEDURE:  Diagnostic hysteroscopy.  Hysteroscopic resection of endometrial polyp and submucosal fibroid.  D and C.  POSTOPERATIVE DIAGNOSES:  Menorrhagia.  Submucosal fibroids.  Endometrial polyp.  ANESTHESIA: General.  SURGEON: Mikah Poss A. Rutherford, MD.  ASSISTANT: None.  DESCRIPTION OF PROCEDURE:  Under adequate general anesthesia, the patient was placed in the dorsal lithotomy position.  She was sterilely prepped and draped in usual fashion.  The patient was not catheterized due to prior voiding. She was sterilely  prepped and draped in the usual fashion. A bivalve speculum was placed in the vagina. Single-tooth tenaculum was placed on the anterior lip of the cervix.  The cervix ____ dilated up to #[facility] dilator. A diagnostic hysteroscope was introduced into the  uterine cavity. Both tubal ostias were seen. Multiple submucosal, intracavitary fibroids were noted and in particular, a posterior submucosal fibroid was noted, which appeared to be just slightly raised. Using the Reach resectoscope, the endometrial  polyp which was noted at the lower uterine segment on the left, was removed as were the other fibroids. At that point, the decision was made to inject some Pitressin that slightly raised posterior fibroid to better resect the fibroid. Once the dilute  solution of Pitressin was injected, with reinsertion of the resectoscope, that posterior slight submucosal fibroid became a much more evident and larger fibroid. Using the Reach resectoscope, the fibroid was partially resected as the fluid deficit had  prevented further continuation of the resection. The endometrial cavity was  also resected. The endocervical canal was inspected. No lesions noted. The procedure was terminated by removing all instruments.   Specimen labeled, endometrial curettings with polyp and fibroid resection was sent to pathology.  ESTIMATED BLOOD LOSS: 3 mL.  FLUID DEFICIT: 2500.  COMPLICATIONS: None.  DISPOSITION: The patient tolerated the procedure well, was transferred to the recovery room in stable condition.   CHR D: 07/09/2024 11:59:44 pm T: 07/10/2024 8:27:00 pm  JOB: [id number]/ [id number]

## 2024-07-12 LAB — SURGICAL PATHOLOGY

## 2024-08-10 DIAGNOSIS — Z79899 Other long term (current) drug therapy: Secondary | ICD-10-CM | POA: Diagnosis not present

## 2024-08-10 DIAGNOSIS — R41840 Attention and concentration deficit: Secondary | ICD-10-CM | POA: Diagnosis not present

## 2024-08-19 DIAGNOSIS — R41840 Attention and concentration deficit: Secondary | ICD-10-CM | POA: Diagnosis not present

## 2024-08-19 DIAGNOSIS — Z79899 Other long term (current) drug therapy: Secondary | ICD-10-CM | POA: Diagnosis not present

## 2024-08-23 DIAGNOSIS — Z79899 Other long term (current) drug therapy: Secondary | ICD-10-CM | POA: Diagnosis not present

## 2024-08-23 DIAGNOSIS — R41840 Attention and concentration deficit: Secondary | ICD-10-CM | POA: Diagnosis not present

## 2024-09-27 DIAGNOSIS — F902 Attention-deficit hyperactivity disorder, combined type: Secondary | ICD-10-CM | POA: Diagnosis not present

## 2024-09-27 DIAGNOSIS — Z79899 Other long term (current) drug therapy: Secondary | ICD-10-CM | POA: Diagnosis not present

## 2024-09-29 DIAGNOSIS — Z79899 Other long term (current) drug therapy: Secondary | ICD-10-CM | POA: Diagnosis not present

## 2024-09-29 DIAGNOSIS — F902 Attention-deficit hyperactivity disorder, combined type: Secondary | ICD-10-CM | POA: Diagnosis not present

## 2024-10-04 ENCOUNTER — Encounter: Payer: Self-pay | Admitting: Radiology

## 2024-11-01 DIAGNOSIS — M62838 Other muscle spasm: Secondary | ICD-10-CM | POA: Diagnosis not present

## 2024-11-01 DIAGNOSIS — M9901 Segmental and somatic dysfunction of cervical region: Secondary | ICD-10-CM | POA: Diagnosis not present

## 2024-11-01 DIAGNOSIS — M9902 Segmental and somatic dysfunction of thoracic region: Secondary | ICD-10-CM | POA: Diagnosis not present

## 2024-11-09 DIAGNOSIS — M9901 Segmental and somatic dysfunction of cervical region: Secondary | ICD-10-CM | POA: Diagnosis not present

## 2024-11-09 DIAGNOSIS — M9903 Segmental and somatic dysfunction of lumbar region: Secondary | ICD-10-CM | POA: Diagnosis not present

## 2024-11-09 DIAGNOSIS — M9902 Segmental and somatic dysfunction of thoracic region: Secondary | ICD-10-CM | POA: Diagnosis not present

## 2024-11-09 DIAGNOSIS — M9904 Segmental and somatic dysfunction of sacral region: Secondary | ICD-10-CM | POA: Diagnosis not present

## 2024-11-12 DIAGNOSIS — H00024 Hordeolum internum left upper eyelid: Secondary | ICD-10-CM | POA: Diagnosis not present

## 2024-11-12 DIAGNOSIS — H1032 Unspecified acute conjunctivitis, left eye: Secondary | ICD-10-CM | POA: Diagnosis not present
